# Patient Record
Sex: Female | Born: 1980 | Race: White | Hispanic: No | Marital: Married | Smoking: Never smoker
Health system: Southern US, Community
[De-identification: ages and names within clinical notes are randomized; demographics above are authoritative.]

## PROBLEM LIST (undated history)

## (undated) DIAGNOSIS — J189 Pneumonia, unspecified organism: Secondary | ICD-10-CM

## (undated) DIAGNOSIS — E785 Hyperlipidemia, unspecified: Secondary | ICD-10-CM

## (undated) DIAGNOSIS — G4730 Sleep apnea, unspecified: Secondary | ICD-10-CM

## (undated) DIAGNOSIS — N920 Excessive and frequent menstruation with regular cycle: Secondary | ICD-10-CM

## (undated) DIAGNOSIS — M797 Fibromyalgia: Secondary | ICD-10-CM

## (undated) DIAGNOSIS — K219 Gastro-esophageal reflux disease without esophagitis: Secondary | ICD-10-CM

## (undated) DIAGNOSIS — D509 Iron deficiency anemia, unspecified: Secondary | ICD-10-CM

## (undated) DIAGNOSIS — N840 Polyp of corpus uteri: Secondary | ICD-10-CM

## (undated) DIAGNOSIS — I10 Essential (primary) hypertension: Secondary | ICD-10-CM

## (undated) DIAGNOSIS — E119 Type 2 diabetes mellitus without complications: Secondary | ICD-10-CM

## (undated) DIAGNOSIS — F419 Anxiety disorder, unspecified: Secondary | ICD-10-CM

## (undated) DIAGNOSIS — F32A Depression, unspecified: Secondary | ICD-10-CM

## (undated) DIAGNOSIS — N3946 Mixed incontinence: Secondary | ICD-10-CM

## (undated) HISTORY — PX: KIDNEY SURGERY: SHX687

## (undated) HISTORY — PX: ROOT CANAL: SHX2363

---

## 2003-08-18 ENCOUNTER — Other Ambulatory Visit: Payer: Self-pay

## 2004-09-17 ENCOUNTER — Emergency Department: Payer: Self-pay | Admitting: Emergency Medicine

## 2005-07-08 ENCOUNTER — Observation Stay: Payer: Self-pay

## 2005-08-05 ENCOUNTER — Observation Stay: Payer: Self-pay

## 2005-08-06 ENCOUNTER — Inpatient Hospital Stay: Payer: Self-pay

## 2005-08-31 ENCOUNTER — Observation Stay: Payer: Self-pay | Admitting: Obstetrics and Gynecology

## 2005-09-05 ENCOUNTER — Inpatient Hospital Stay: Payer: Self-pay

## 2006-07-20 ENCOUNTER — Ambulatory Visit: Payer: Self-pay

## 2007-03-25 ENCOUNTER — Ambulatory Visit: Payer: Self-pay

## 2007-09-25 ENCOUNTER — Observation Stay: Payer: Self-pay | Admitting: Obstetrics and Gynecology

## 2007-11-12 ENCOUNTER — Observation Stay: Payer: Self-pay | Admitting: Obstetrics and Gynecology

## 2007-11-15 ENCOUNTER — Inpatient Hospital Stay: Payer: Self-pay

## 2008-09-19 ENCOUNTER — Emergency Department: Payer: Self-pay | Admitting: Emergency Medicine

## 2008-09-24 ENCOUNTER — Ambulatory Visit: Payer: Self-pay | Admitting: Urology

## 2008-09-28 ENCOUNTER — Ambulatory Visit: Payer: Self-pay | Admitting: Urology

## 2008-09-29 ENCOUNTER — Ambulatory Visit: Payer: Self-pay | Admitting: Urology

## 2008-10-21 ENCOUNTER — Ambulatory Visit: Payer: Self-pay | Admitting: Urology

## 2008-11-14 ENCOUNTER — Other Ambulatory Visit: Payer: Self-pay

## 2008-11-17 ENCOUNTER — Ambulatory Visit: Payer: Self-pay

## 2008-11-26 ENCOUNTER — Encounter (INDEPENDENT_AMBULATORY_CARE_PROVIDER_SITE_OTHER): Payer: Self-pay | Admitting: Urology

## 2008-11-26 ENCOUNTER — Inpatient Hospital Stay (HOSPITAL_COMMUNITY): Admission: RE | Admit: 2008-11-26 | Discharge: 2008-11-27 | Payer: Self-pay | Admitting: Urology

## 2010-03-27 ENCOUNTER — Encounter: Payer: Self-pay | Admitting: Urology

## 2010-06-10 LAB — BASIC METABOLIC PANEL
BUN: 10 mg/dL (ref 6–23)
CO2: 25 mEq/L (ref 19–32)
CO2: 28 mEq/L (ref 19–32)
Calcium: 8.6 mg/dL (ref 8.4–10.5)
Calcium: 9.3 mg/dL (ref 8.4–10.5)
Chloride: 107 mEq/L (ref 96–112)
Creatinine, Ser: 0.63 mg/dL (ref 0.4–1.2)
GFR calc Af Amer: >60 mL/min (ref >60)
GFR calc non Af Amer: >60 mL/min (ref >60)
GFR calc non Af Amer: >60 mL/min (ref >60)
GFR calc non Af Amer: >60 mL/min (ref >60)
Glucose, Bld: 104 mg/dL — ABNORMAL HIGH (ref 70–99)
Glucose, Bld: 116 mg/dL — ABNORMAL HIGH (ref 70–99)
Potassium: 3.9 mEq/L (ref 3.5–5.1)
Sodium: 138 mEq/L (ref 135–145)
Sodium: 138 mEq/L (ref 135–145)
Sodium: 139 mEq/L (ref 135–145)

## 2010-06-10 LAB — CBC
HCT: 35.2 % — ABNORMAL LOW (ref 36.0–46.0)
Hemoglobin: 11.7 g/dL — ABNORMAL LOW (ref 12.0–15.0)
Platelets: 231 10*3/uL (ref 150–400)
RBC: 4.49 MIL/uL (ref 3.87–5.11)
RDW: 14.3 % (ref 11.5–15.5)

## 2010-06-10 LAB — CREATININE, FLUID (PLEURAL, PERITONEAL, JP DRAINAGE): Creat, Fluid: 0.6 mg/dL

## 2010-06-10 LAB — HEMOGLOBIN AND HEMATOCRIT, BLOOD
HCT: 32.5 % — ABNORMAL LOW (ref 36.0–46.0)
HCT: 33.2 % — ABNORMAL LOW (ref 36.0–46.0)
Hemoglobin: 10.7 g/dL — ABNORMAL LOW (ref 12.0–15.0)
Hemoglobin: 10.9 g/dL — ABNORMAL LOW (ref 12.0–15.0)

## 2010-06-10 LAB — TYPE AND SCREEN

## 2011-05-07 IMAGING — CT CT STONE STUDY
1 of 2 series · 15 of 32 positions shown, 19 images · non-contrast
Comparison: none

REASON FOR EXAM: l flank pain radiaTING TO GROIN
COMMENTS:

[Series 2: stone · axial · 0.77mm/px · z∈[-520,-78]mm · 15 of 163 slices shown, 19 images]
[im 8/163  soft-tissue]
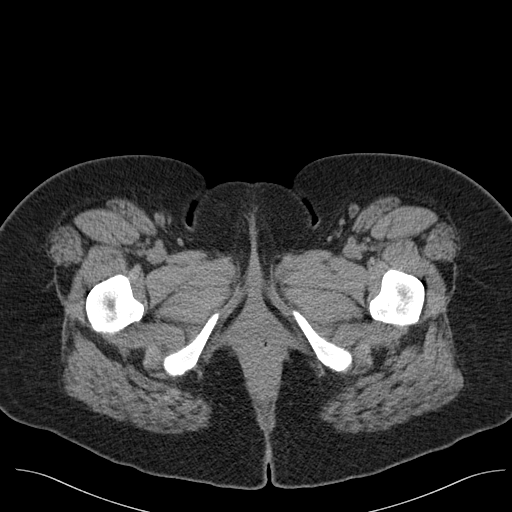
[im 8/163  bone]
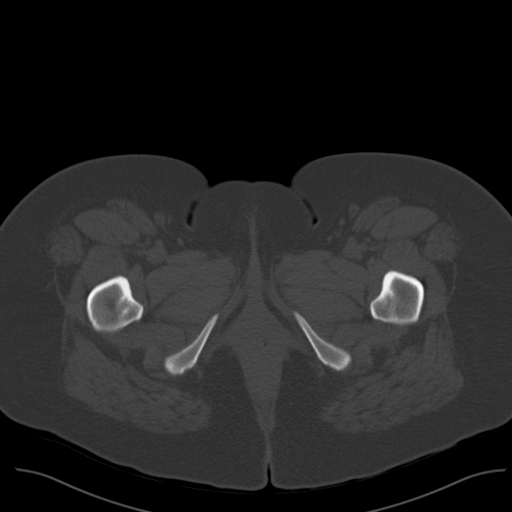
[im 22/163  soft-tissue]
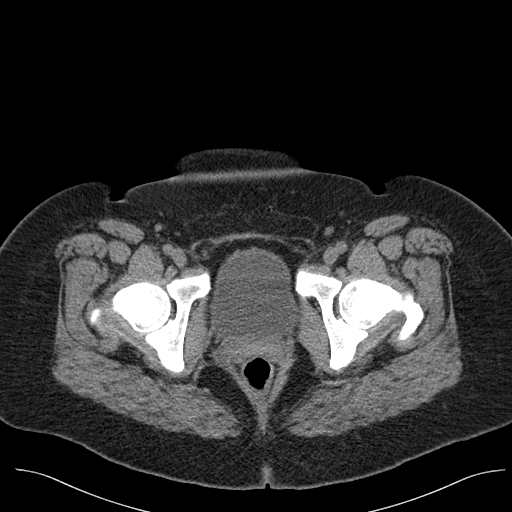
[im 36/163  soft-tissue]
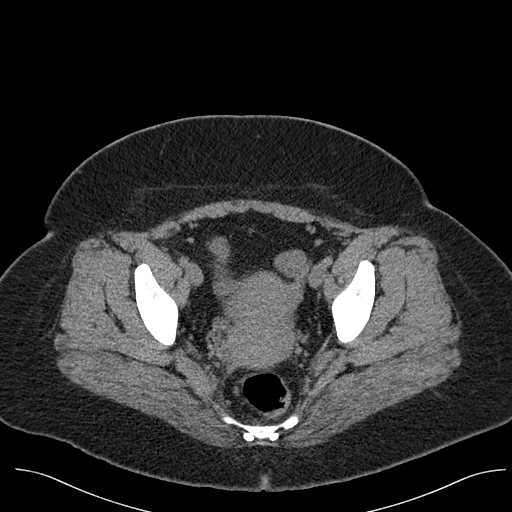
[im 43/163  soft-tissue]
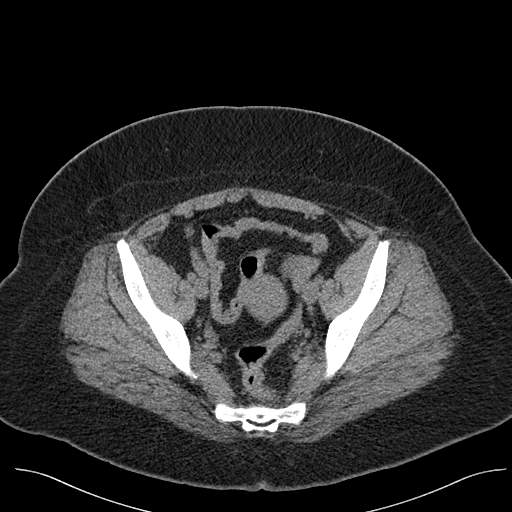
[im 57/163  soft-tissue]
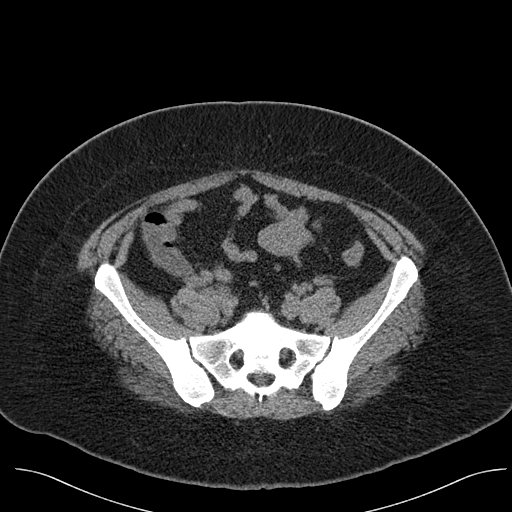
[im 71/163  soft-tissue]
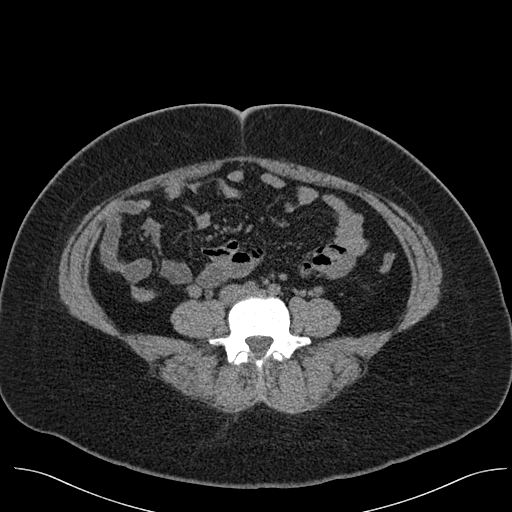
[im 85/163  soft-tissue]
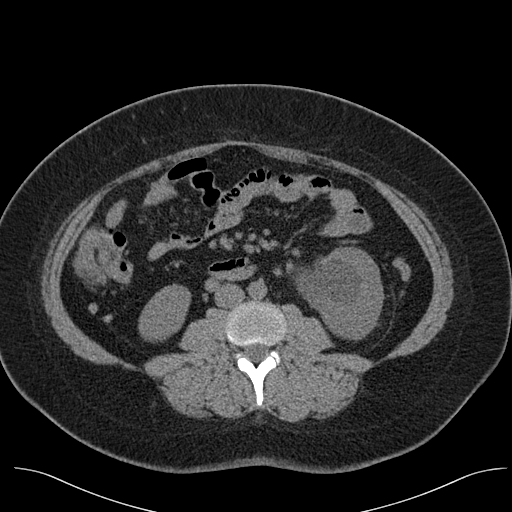
[im 92/163  soft-tissue]
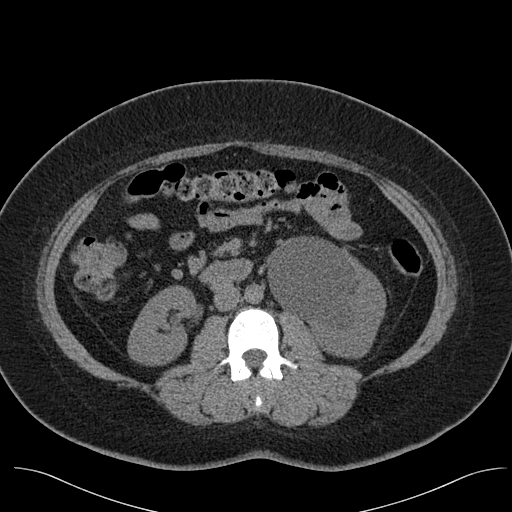
[im 106/163  soft-tissue]
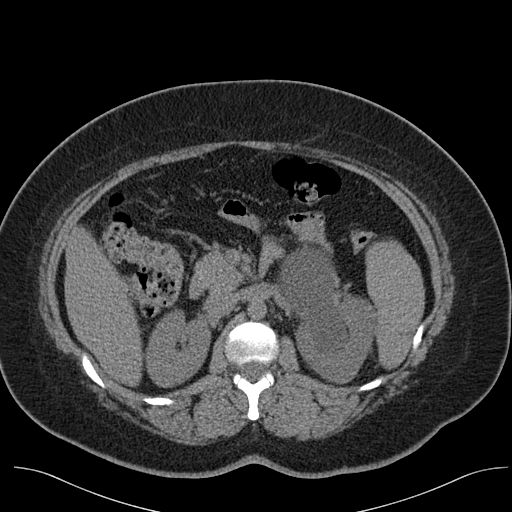
[im 106/163  bone]
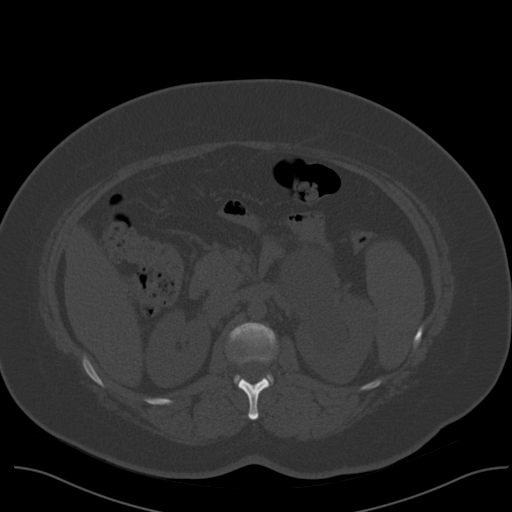
[im 120/163  soft-tissue]
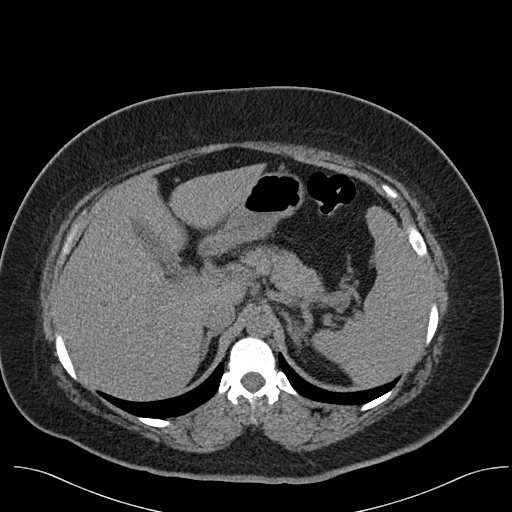
[im 127/163  soft-tissue]
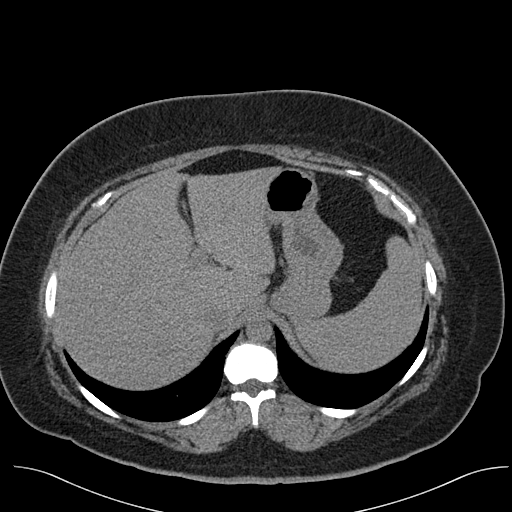
[im 134/163  lung]
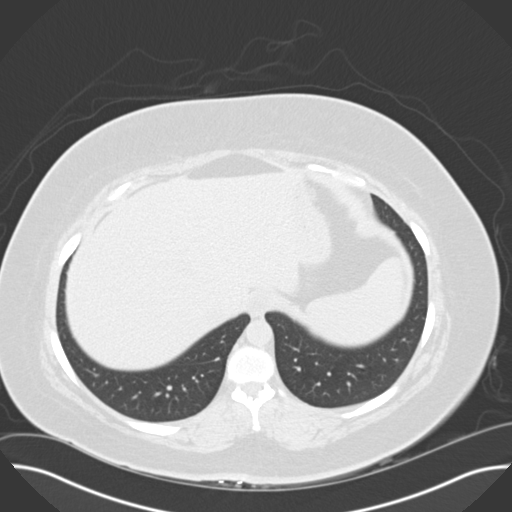
[im 141/163  soft-tissue]
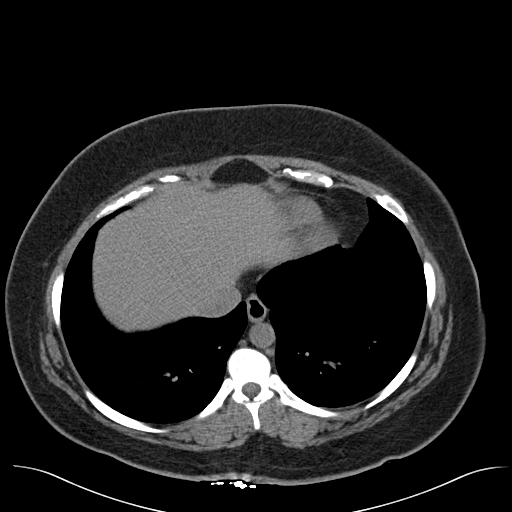
[im 141/163  lung]
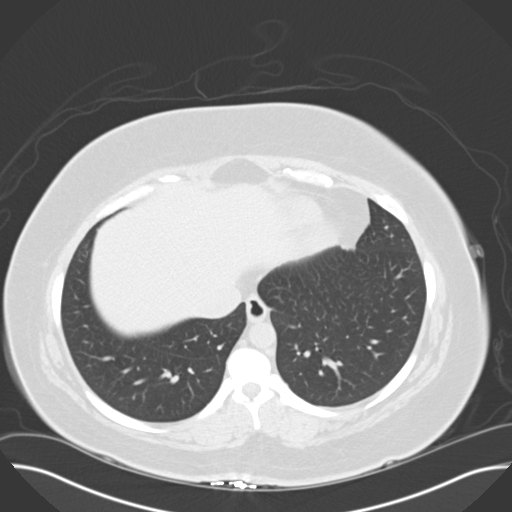
[im 148/163  lung]
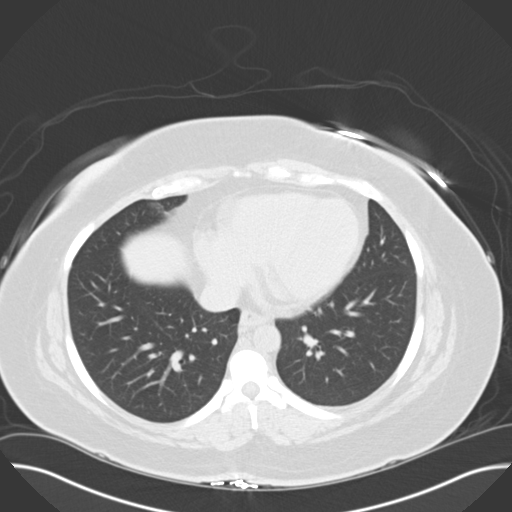
[im 155/163  soft-tissue]
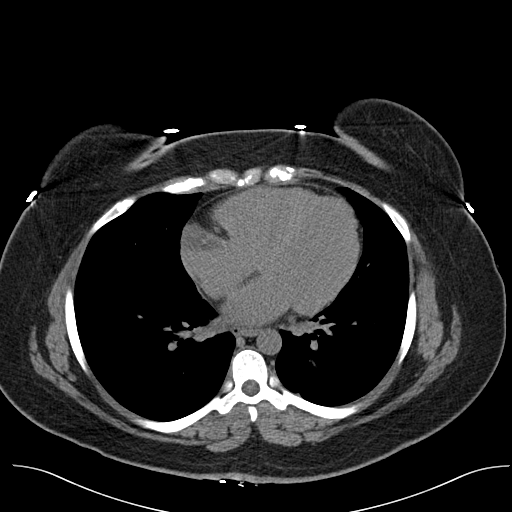
[im 155/163  lung]
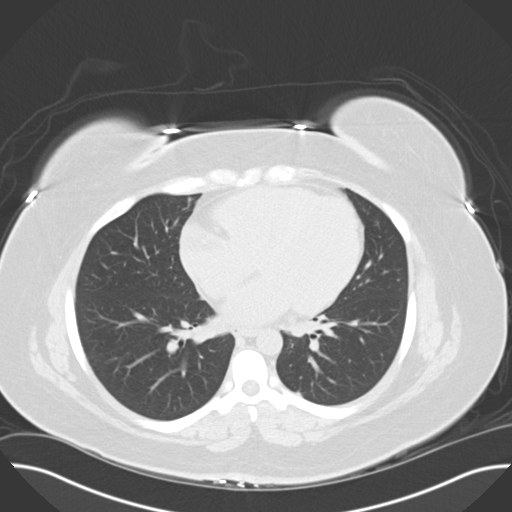

[15 of 32 positions shown; findings below may reference images not displayed]

PROCEDURE:     CT  - CT ABDOMEN /PELVIS WO (STONE)  - September 19, 2008  [DATE]

RESULT:        Helical 3 mm sections were obtained from the lung bases
through the pubic symphysis without the administration of oral or
intravenous contrast.

Evaluation of the lung bases demonstrates no gross abnormalities.

Noncontrast evaluation of the liver, spleen, adrenals and pancreas is
unremarkable.

Evaluation of the left kidney demonstrates moderate to severe hydronephrosis
as well as severe pelviectasis versus possibly a large parapelvic cyst.
There is no significant hydroureter. Projecting within the distal left
ureter, a 5 mm calculus is appreciated.  There is no evidence of abdominal
or pelvic free fluid, drainable loculated fluid collections. No secondary
signs reflecting bowel obstruction, enteritis, colitis, diverticulitis,
appendicitis or an abdominal aortic aneurysm is appreciated.
IMPRESSION: 1.     Findings consistent with a distal left ureteral calculus with
associated severe obstructive uropathy. Note, the findings within the kidney
raise the suspicion of either a parapelvic cyst compromising a portion of
the low attenuating area within the region of the renal pelvis versus
possibly a component of a partial UPJ obstruction. Alternatively, the severe
pelvic findings may be secondary to the previously described calculus.
2.     Dr. Avvisati of the Emergency Department was informed of these
findings at the time of the initial interpretation.

## 2011-06-08 IMAGING — CR DG ABDOMEN 1V
1 series · 2 of 2 positions shown · non-contrast
Comparison: none

REASON FOR EXAM: hydronephrosis  renal colic pt need films
COMMENTS:

PROCEDURE:     DXR - DXR KIDNEY URETER BLADDER  - October 21, 2008  [DATE]
RESULT:     There is a double-J ureteral stent in place on the left. The
bowel gas pattern is normal. The bony structures exhibit no acute
abnormality.

[Series 1: view not recorded · 0.17mm/px · 2 of 2 slices shown]
[im 1/2]
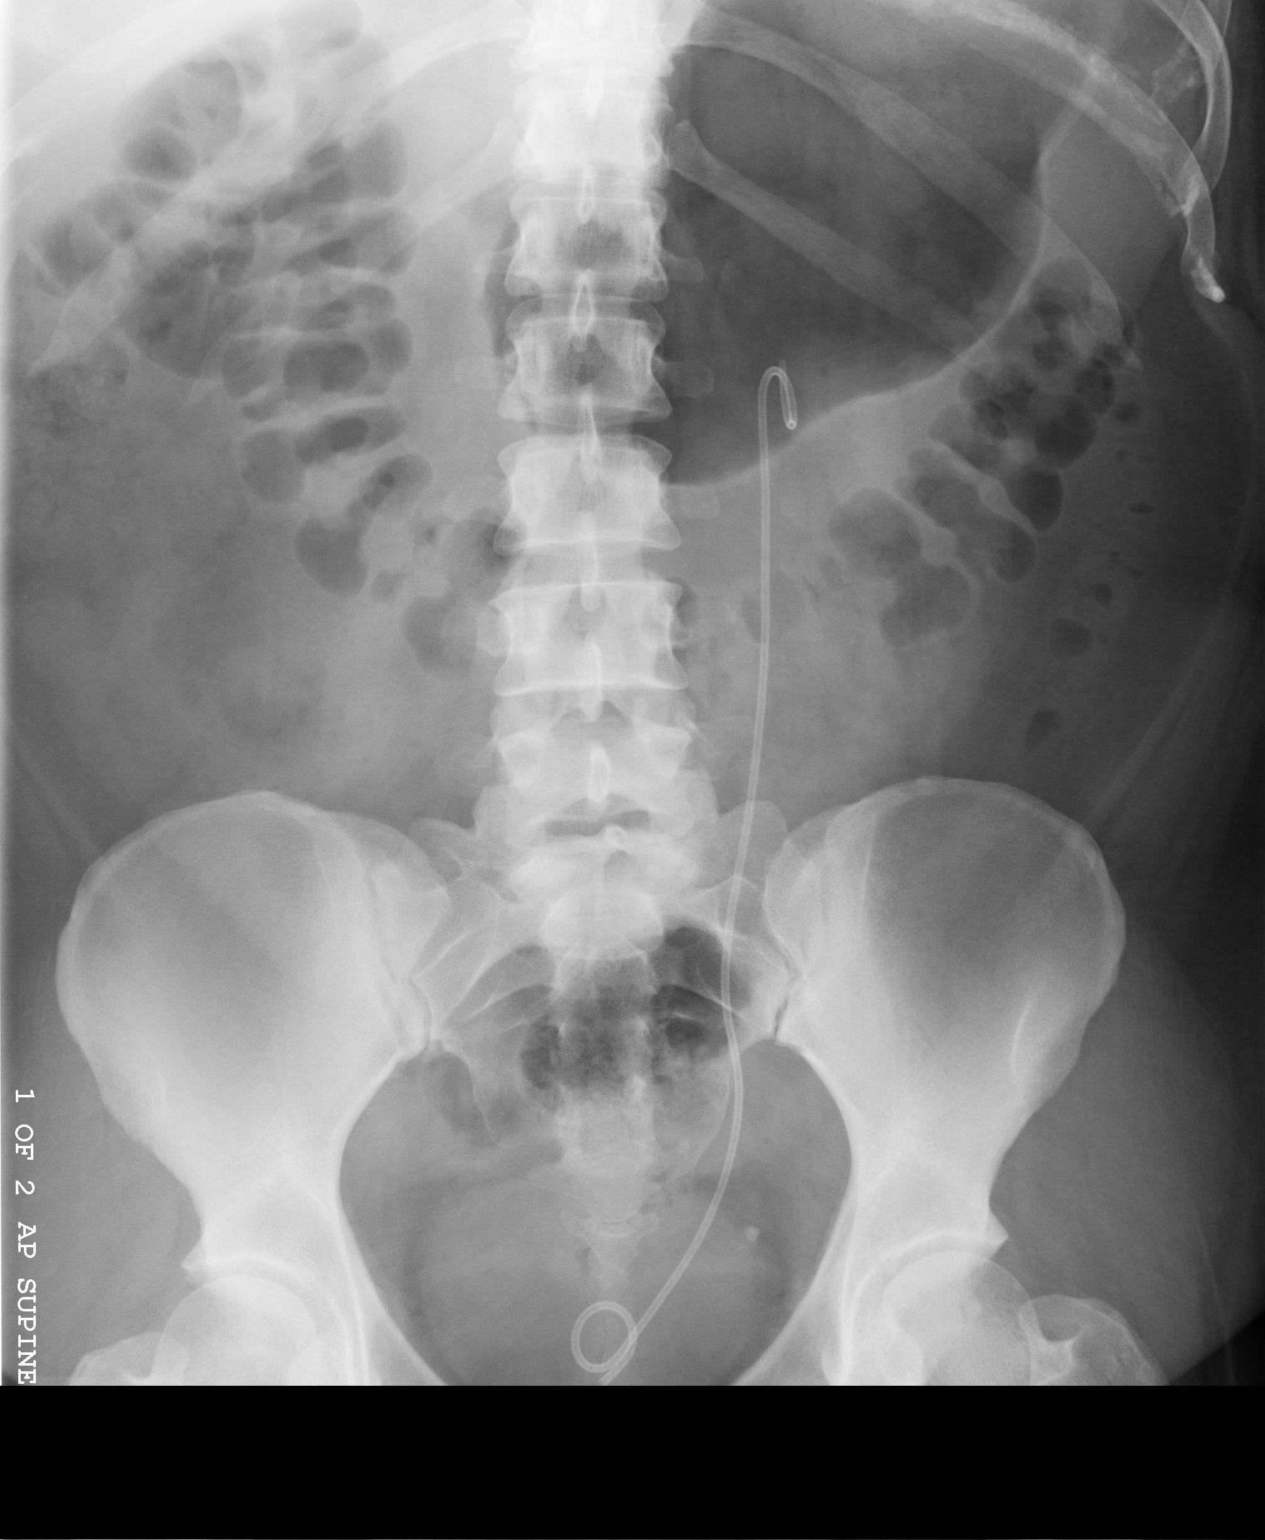
[im 2/2]
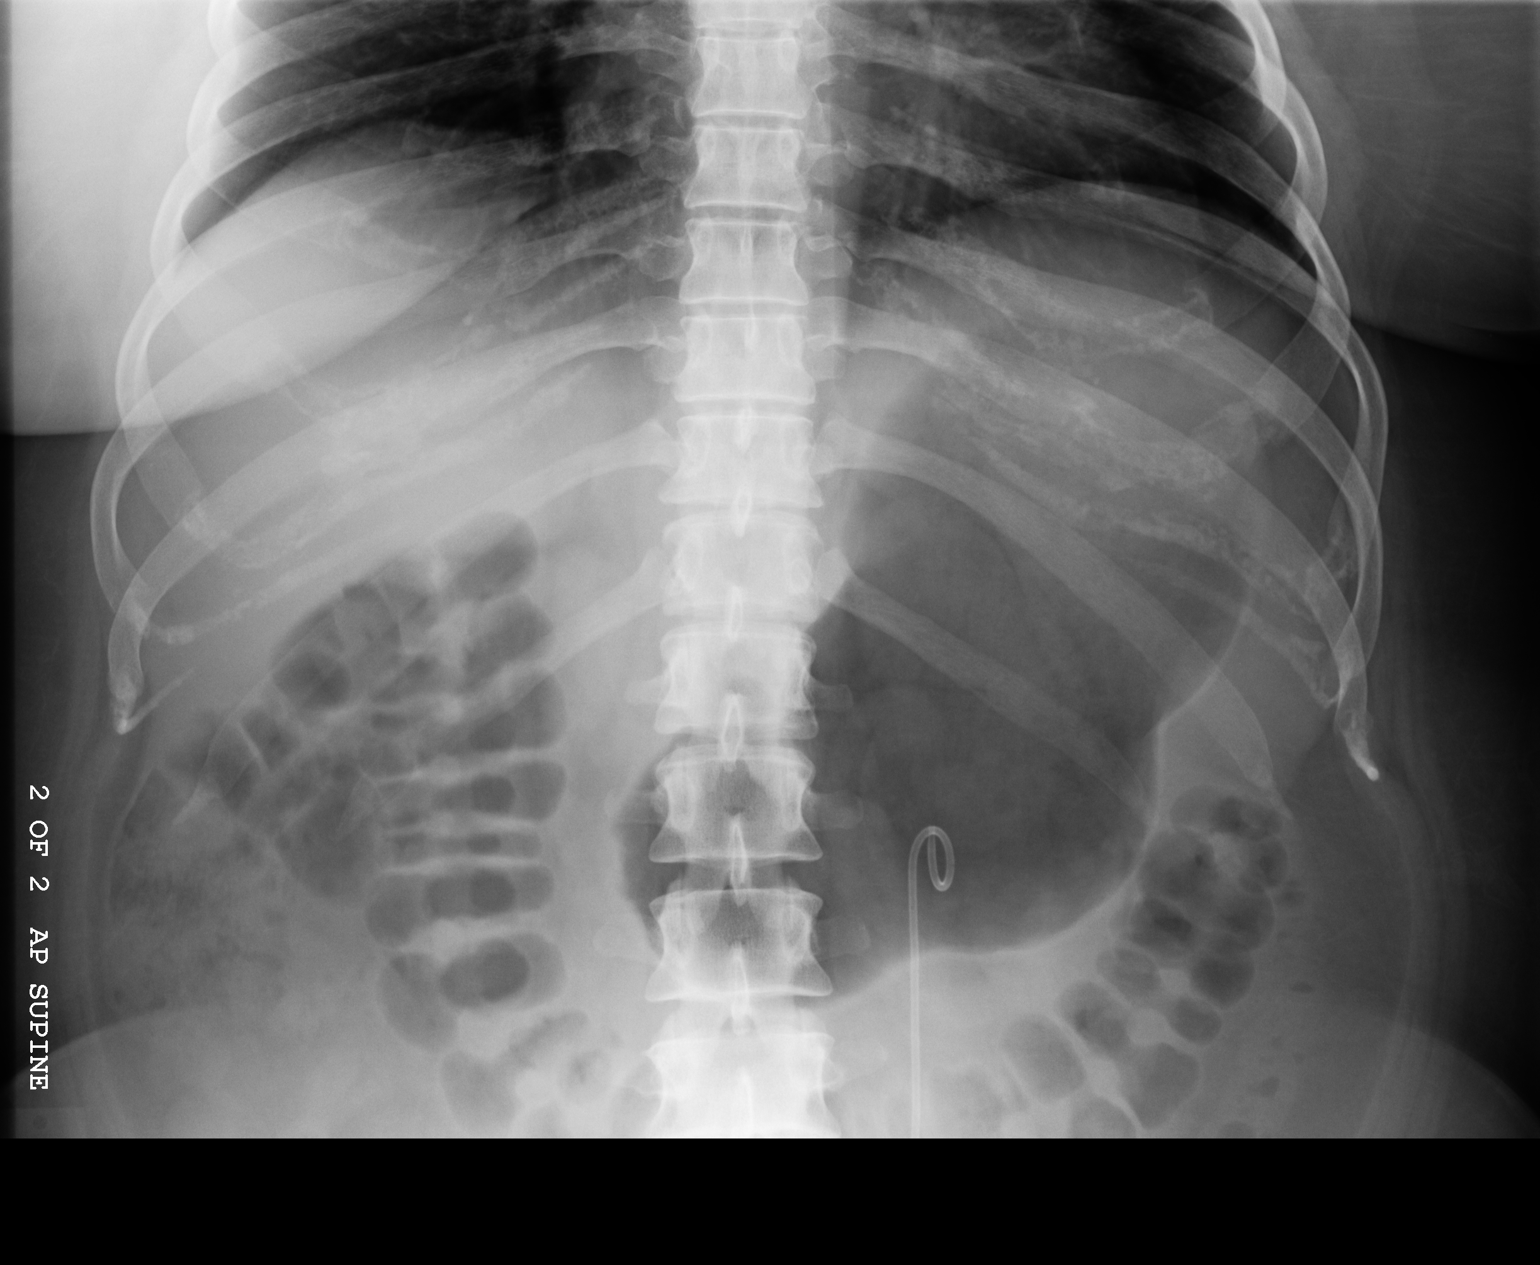

[2 of 2 positions shown; findings below may reference images not displayed]

IMPRESSION: There is a double-J ureteral stent in place on the left.

## 2014-11-11 ENCOUNTER — Other Ambulatory Visit: Payer: Self-pay | Admitting: Adult Health

## 2014-11-11 ENCOUNTER — Other Ambulatory Visit: Payer: Self-pay | Admitting: Family Medicine

## 2014-11-11 DIAGNOSIS — R599 Enlarged lymph nodes, unspecified: Secondary | ICD-10-CM

## 2014-11-17 ENCOUNTER — Ambulatory Visit
Admission: RE | Admit: 2014-11-17 | Discharge: 2014-11-17 | Disposition: A | Discharge status: Home / Self Care | Payer: Self-pay | Source: Ambulatory Visit | Attending: Family Medicine | Admitting: Family Medicine

## 2014-11-17 DIAGNOSIS — R591 Generalized enlarged lymph nodes: Secondary | ICD-10-CM | POA: Insufficient documentation

## 2014-11-17 DIAGNOSIS — R599 Enlarged lymph nodes, unspecified: Secondary | ICD-10-CM

## 2015-06-04 ENCOUNTER — Encounter: Payer: Self-pay | Admitting: *Deleted

## 2015-06-07 ENCOUNTER — Ambulatory Visit: Payer: Self-pay | Admitting: Certified Registered Nurse Anesthetist

## 2015-06-07 ENCOUNTER — Encounter
Admission: RE | Disposition: A | Discharge status: Home / Self Care | Payer: Self-pay | Source: Ambulatory Visit | Attending: Unknown Physician Specialty

## 2015-06-07 ENCOUNTER — Ambulatory Visit
Admission: RE | Admit: 2015-06-07 | Discharge: 2015-06-07 | Disposition: A | Discharge status: Home / Self Care | Payer: Self-pay | Source: Ambulatory Visit | Attending: Unknown Physician Specialty | Admitting: Unknown Physician Specialty

## 2015-06-07 ENCOUNTER — Encounter: Payer: Self-pay | Admitting: *Deleted

## 2015-06-07 DIAGNOSIS — K210 Gastro-esophageal reflux disease with esophagitis: Secondary | ICD-10-CM | POA: Insufficient documentation

## 2015-06-07 DIAGNOSIS — K2950 Unspecified chronic gastritis without bleeding: Secondary | ICD-10-CM | POA: Insufficient documentation

## 2015-06-07 DIAGNOSIS — Z79899 Other long term (current) drug therapy: Secondary | ICD-10-CM | POA: Insufficient documentation

## 2015-06-07 DIAGNOSIS — F419 Anxiety disorder, unspecified: Secondary | ICD-10-CM | POA: Insufficient documentation

## 2015-06-07 DIAGNOSIS — K319 Disease of stomach and duodenum, unspecified: Secondary | ICD-10-CM | POA: Insufficient documentation

## 2015-06-07 DIAGNOSIS — R1013 Epigastric pain: Secondary | ICD-10-CM | POA: Insufficient documentation

## 2015-06-07 DIAGNOSIS — I10 Essential (primary) hypertension: Secondary | ICD-10-CM | POA: Insufficient documentation

## 2015-06-07 HISTORY — DX: Anxiety disorder, unspecified: F41.9

## 2015-06-07 HISTORY — DX: Gastro-esophageal reflux disease without esophagitis: K21.9

## 2015-06-07 HISTORY — PX: ESOPHAGOGASTRODUODENOSCOPY (EGD) WITH PROPOFOL: SHX5813

## 2015-06-07 HISTORY — DX: Essential (primary) hypertension: I10

## 2015-06-07 LAB — POCT PREGNANCY, URINE: Preg Test, Ur: NEGATIVE

## 2015-06-07 SURGERY — ESOPHAGOGASTRODUODENOSCOPY (EGD) WITH PROPOFOL
Anesthesia: General

## 2015-06-07 MED ORDER — PROPOFOL 10 MG/ML IV BOLUS
INTRAVENOUS | Status: DC | PRN
  Administered 2015-06-07: 30 mg via INTRAVENOUS
  Administered 2015-06-07: 20 mg via INTRAVENOUS
  Administered 2015-06-07: 10 mg via INTRAVENOUS
  Administered 2015-06-07: 20 mg via INTRAVENOUS

## 2015-06-07 MED ORDER — PROPOFOL 500 MG/50ML IV EMUL
INTRAVENOUS | Status: DC | PRN
  Administered 2015-06-07: 120 ug/kg/min via INTRAVENOUS

## 2015-06-07 MED ORDER — LIDOCAINE HCL (CARDIAC) 20 MG/ML IV SOLN
INTRAVENOUS | Status: DC | PRN
  Administered 2015-06-07: 60 mg via INTRAVENOUS

## 2015-06-07 MED ORDER — SODIUM CHLORIDE 0.9 % IV SOLN
INTRAVENOUS | Status: DC
  Administered 2015-06-07: 1000 mL via INTRAVENOUS

## 2015-06-07 MED ORDER — SODIUM CHLORIDE 0.9 % IV SOLN: INTRAVENOUS | Status: DC

## 2015-06-07 MED ORDER — MIDAZOLAM HCL 2 MG/2ML IJ SOLN
INTRAMUSCULAR | Status: DC | PRN
  Administered 2015-06-07: 1 mg via INTRAVENOUS

## 2015-06-07 NOTE — Op Note (Signed)
[facility] Gastroenterology Patient Name: Samantha Good Procedure Date: 06/07/2015 3:01 PM MRN: [id number] Account #: [id number] Date of Birth: 06-02-1980 Admit Type: Outpatient Age: 35 Room: [facility] ENDO ROOM 1 Gender: Female Note Status: Finalized Procedure:            Upper GI endoscopy Indications:          Epigastric abdominal pain, Heartburn Providers:            Manya Silvas, MD Referring MD:         Youlanda Roys. Lovie Macadamia, MD (Referring MD) Medicines:            Propofol per Anesthesia Complications:        No immediate complications. Procedure:            Pre-Anesthesia Assessment:                       - After reviewing the risks and benefits, the patient                        was deemed in satisfactory condition to undergo the                        procedure.                       After obtaining informed consent, the endoscope was                        passed under direct vision. Throughout the procedure,                        the patient's blood pressure, pulse, and oxygen                        saturations were monitored continuously. The Endoscope                        was introduced through the mouth, and advanced to the                        second part of duodenum. The upper GI endoscopy was                        accomplished without difficulty. The patient tolerated                        the procedure well. Findings:      LA Grade A (one or more mucosal breaks less than 5 mm, not extending       between tops of 2 mucosal folds) esophagitis with no bleeding was found       36 cm from the incisors. Biopsies were taken with a cold forceps for       histology.      Diffuse mildly erythematous mucosa without bleeding was found in the       gastric antrum. Biopsies were taken with a cold forceps for histology.       Biopsies were taken with a cold forceps for Helicobacter pylori testing.      Diffuse minimal inflammation characterized by  erythema and granularity       was found in the gastric  body. Biopsies were taken with a cold forceps       for histology. Biopsies were taken with a cold forceps for Helicobacter       pylori testing.      The examined duodenum was normal. Impression:           - LA Grade A reflux esophagitis. Rule out Barrett's                        esophagus. Biopsied.                       - Erythematous mucosa in the antrum. Biopsied.                       - Gastritis. Biopsied.                       - Normal examined duodenum.                       Continue Omeprazole, consider adding in Zantac or                        increase omeprazole. Recommendation:       - Await pathology results. Manya Silvas, MD 06/07/2015 3:40:02 PM This report has been signed electronically. Number of Addenda: 0 Note Initiated On: 06/07/2015 3:01 PM      [facility]

## 2015-06-07 NOTE — Anesthesia Procedure Notes (Signed)
Date/Time: 06/07/2015 3:13 PM Performed by: Johnna Acosta Pre-anesthesia Checklist: Patient identified, Emergency Drugs available, Suction available, Patient being monitored and Timeout performed Patient Re-evaluated:Patient Re-evaluated prior to inductionOxygen Delivery Method: Nasal cannula

## 2015-06-07 NOTE — Transfer of Care (Signed)
Immediate Anesthesia Transfer of Care Note  Patient: Samantha Good  Procedure(s) Performed: Procedure(s): ESOPHAGOGASTRODUODENOSCOPY (EGD) WITH PROPOFOL (N/A)  Patient Location: PACU  Anesthesia Type:General  Level of Consciousness: sedated  Airway & Oxygen Therapy: Patient Spontanous Breathing and Patient connected to nasal cannula oxygen  Post-op Assessment: Report given to RN and Post -op Vital signs reviewed and stable  Post vital signs: Reviewed and stable  Last Vitals:  Filed Vitals:   06/07/15 1445  BP: 144/93  Pulse: 95  Temp: 35.9 C  Resp: 20    Complications: No apparent anesthesia complications

## 2015-06-07 NOTE — H&P (Signed)
   Primary Care Physician:  Juluis Pitch, MD Primary Gastroenterologist:  Dr. Vira Agar  Pre-Procedure History & Physical: HPI:  Samantha Good is a 35 y.o. female is here for an endoscopy.   Past Medical History  Diagnosis Date  . GERD (gastroesophageal reflux disease)   . Anxiety   . Hypertension     Past Surgical History  Procedure Laterality Date  . Kidney surgery      Prior to Admission medications   Medication Sig Start Date End Date Taking? Authorizing Provider  ALPRAZolam Duanne Moron) 0.5 MG tablet Take 0.5 mg by mouth at bedtime as needed for anxiety.   Yes Historical Provider, MD  buPROPion (WELLBUTRIN XL) 150 MG 24 hr tablet Take 150 mg by mouth daily.   Yes Historical Provider, MD  lisinopril (PRINIVIL,ZESTRIL) 20 MG tablet Take 20 mg by mouth daily.   Yes Historical Provider, MD  sertraline (ZOLOFT) 100 MG tablet Take 100 mg by mouth daily.   Yes Historical Provider, MD  doxycycline (MONODOX) 100 MG capsule Take 100 mg by mouth 2 (two) times daily. Reported on 06/07/2015    Historical Provider, MD    Allergies as of 05/28/2015  . (Not on File)    History reviewed. No pertinent family history.  Social History   Social History  . Marital Status: Married    Spouse Name: N/A  . Number of Children: N/A  . Years of Education: N/A   Occupational History  . Not on file.   Social History Main Topics  . Smoking status: Never Smoker   . Smokeless tobacco: Never Used  . Alcohol Use: No  . Drug Use: No  . Sexual Activity: Not on file   Other Topics Concern  . Not on file   Social History Narrative    Review of Systems: See HPI, otherwise negative ROS  Physical Exam: BP 144/93 mmHg  Pulse 95  Temp(Src) 96.7 F (35.9 C) (Tympanic)  Resp 20  Ht 5\' 1"  (1.549 m)  Wt 92.534 kg (204 lb)  BMI 38.57 kg/m2  SpO2 100%  LMP 06/01/2015 General:   Alert,  pleasant and cooperative in NAD Head:  Normocephalic and atraumatic. Neck:  Supple; no masses or  thyromegaly. Lungs:  Clear throughout to auscultation.    Heart:  Regular rate and rhythm. Abdomen:  Soft, nontender and nondistended. Normal bowel sounds, without guarding, and without rebound.   Neurologic:  Alert and  oriented x4;  grossly normal neurologically.  Impression/Plan: Samantha Good is here for an endoscopy to be performed for heartburn and abdominal pain  Risks, benefits, limitations, and alternatives regarding  endoscopy have been reviewed with the patient.  Questions have been answered.  All parties agreeable.   Gaylyn Cheers, MD  06/07/2015, 3:11 PM

## 2015-06-07 NOTE — Anesthesia Preprocedure Evaluation (Signed)
Anesthesia Evaluation  Patient identified by MRN, date of birth, ID band Patient awake    Reviewed: Allergy & Precautions, H&P , NPO status , Patient's Chart, lab work & pertinent test results, reviewed documented beta blocker date and time   History of Anesthesia Complications Negative for: history of anesthetic complications  Airway Mallampati: II  TM Distance: >3 FB Neck ROM: full    Dental no notable dental hx. (+) Missing, Teeth Intact   Pulmonary neg pulmonary ROS,    Pulmonary exam normal breath sounds clear to auscultation       Cardiovascular Exercise Tolerance: Good hypertension, On Medications (-) angina(-) CAD, (-) Past MI, (-) Cardiac Stents and (-) CABG Normal cardiovascular exam(-) dysrhythmias (-) Valvular Problems/Murmurs Rhythm:regular Rate:Normal     Neuro/Psych negative neurological ROS  negative psych ROS   GI/Hepatic Neg liver ROS, GERD  ,  Endo/Other  diabetes (borderline)Morbid obesity  Renal/GU negative Renal ROS  negative genitourinary   Musculoskeletal   Abdominal   Peds  Hematology negative hematology ROS (+)   Anesthesia Other Findings Past Medical History:   GERD (gastroesophageal reflux disease)                       Anxiety                                                      Hypertension                                                 Reproductive/Obstetrics negative OB ROS                             Anesthesia Physical Anesthesia Plan  ASA: III  Anesthesia Plan: General   Post-op Pain Management:    Induction:   Airway Management Planned:   Additional Equipment:   Intra-op Plan:   Post-operative Plan:   Informed Consent: I have reviewed the patients History and Physical, chart, labs and discussed the procedure including the risks, benefits and alternatives for the proposed anesthesia with the patient or authorized representative who has  indicated his/her understanding and acceptance.   Dental Advisory Given  Plan Discussed with: Anesthesiologist, CRNA and Surgeon  Anesthesia Plan Comments:         Anesthesia Quick Evaluation

## 2015-06-08 ENCOUNTER — Encounter: Payer: Self-pay | Admitting: Unknown Physician Specialty

## 2015-06-08 NOTE — Anesthesia Postprocedure Evaluation (Signed)
Anesthesia Post Note  Patient: Samantha Good  Procedure(s) Performed: Procedure(s) (LRB): ESOPHAGOGASTRODUODENOSCOPY (EGD) WITH PROPOFOL (N/A)  Patient location during evaluation: Endoscopy Anesthesia Type: General Level of consciousness: awake and alert Pain management: pain level controlled Vital Signs Assessment: post-procedure vital signs reviewed and stable Respiratory status: spontaneous breathing, nonlabored ventilation, respiratory function stable and patient connected to nasal cannula oxygen Cardiovascular status: blood pressure returned to baseline and stable Postop Assessment: no signs of nausea or vomiting Anesthetic complications: no    Last Vitals:  Filed Vitals:   06/07/15 1553 06/07/15 1603  BP: 127/86 139/72  Pulse: 86 78  Temp:    Resp: 21 16    Last Pain:  Filed Vitals:   06/08/15 0801  PainSc: 0-No pain                 [name]

## 2015-06-09 LAB — SURGICAL PATHOLOGY

## 2016-07-05 ENCOUNTER — Other Ambulatory Visit: Payer: Self-pay | Admitting: Nurse Practitioner

## 2016-07-05 DIAGNOSIS — R109 Unspecified abdominal pain: Secondary | ICD-10-CM

## 2016-07-06 ENCOUNTER — Ambulatory Visit
Admission: RE | Admit: 2016-07-06 | Discharge: 2016-07-06 | Disposition: A | Discharge status: Home / Self Care | Payer: BLUE CROSS/BLUE SHIELD | Source: Ambulatory Visit | Attending: Nurse Practitioner | Admitting: Nurse Practitioner

## 2016-07-06 DIAGNOSIS — R109 Unspecified abdominal pain: Secondary | ICD-10-CM | POA: Diagnosis not present

## 2016-07-06 DIAGNOSIS — R160 Hepatomegaly, not elsewhere classified: Secondary | ICD-10-CM | POA: Diagnosis not present

## 2017-07-31 DIAGNOSIS — L709 Acne, unspecified: Secondary | ICD-10-CM | POA: Diagnosis not present

## 2017-07-31 DIAGNOSIS — R05 Cough: Secondary | ICD-10-CM | POA: Diagnosis not present

## 2017-08-13 DIAGNOSIS — I10 Essential (primary) hypertension: Secondary | ICD-10-CM | POA: Diagnosis not present

## 2017-08-13 DIAGNOSIS — Z79899 Other long term (current) drug therapy: Secondary | ICD-10-CM | POA: Diagnosis not present

## 2017-08-13 DIAGNOSIS — D649 Anemia, unspecified: Secondary | ICD-10-CM | POA: Diagnosis not present

## 2017-11-27 ENCOUNTER — Other Ambulatory Visit: Payer: Self-pay | Admitting: Nurse Practitioner

## 2017-11-27 DIAGNOSIS — K7689 Other specified diseases of liver: Secondary | ICD-10-CM

## 2017-11-27 DIAGNOSIS — R1013 Epigastric pain: Secondary | ICD-10-CM | POA: Diagnosis not present

## 2017-11-27 DIAGNOSIS — D649 Anemia, unspecified: Secondary | ICD-10-CM | POA: Diagnosis not present

## 2017-11-29 ENCOUNTER — Ambulatory Visit
Admission: RE | Admit: 2017-11-29 | Discharge: 2017-11-29 | Disposition: A | Discharge status: Home / Self Care | Payer: 59 | Source: Ambulatory Visit | Attending: Nurse Practitioner | Admitting: Nurse Practitioner

## 2017-11-29 DIAGNOSIS — K7689 Other specified diseases of liver: Secondary | ICD-10-CM | POA: Diagnosis not present

## 2017-11-29 DIAGNOSIS — D1803 Hemangioma of intra-abdominal structures: Secondary | ICD-10-CM | POA: Diagnosis not present

## 2017-11-29 DIAGNOSIS — D649 Anemia, unspecified: Secondary | ICD-10-CM | POA: Insufficient documentation

## 2017-11-29 DIAGNOSIS — D1809 Hemangioma of other sites: Secondary | ICD-10-CM | POA: Diagnosis not present

## 2017-11-29 MED ORDER — GADOBENATE DIMEGLUMINE 529 MG/ML IV SOLN
20.0000 mL | Freq: Once | INTRAVENOUS | Status: AC | PRN
  Administered 2017-11-29: 19 mL via INTRAVENOUS

## 2017-12-07 ENCOUNTER — Other Ambulatory Visit: Payer: BLUE CROSS/BLUE SHIELD

## 2017-12-18 DIAGNOSIS — K5901 Slow transit constipation: Secondary | ICD-10-CM | POA: Diagnosis not present

## 2017-12-18 DIAGNOSIS — K210 Gastro-esophageal reflux disease with esophagitis: Secondary | ICD-10-CM | POA: Diagnosis not present

## 2018-01-17 DIAGNOSIS — K2950 Unspecified chronic gastritis without bleeding: Secondary | ICD-10-CM | POA: Diagnosis not present

## 2018-01-17 DIAGNOSIS — K449 Diaphragmatic hernia without obstruction or gangrene: Secondary | ICD-10-CM | POA: Diagnosis not present

## 2018-01-17 DIAGNOSIS — K2970 Gastritis, unspecified, without bleeding: Secondary | ICD-10-CM | POA: Diagnosis not present

## 2018-01-17 DIAGNOSIS — D509 Iron deficiency anemia, unspecified: Secondary | ICD-10-CM | POA: Diagnosis not present

## 2018-01-17 DIAGNOSIS — K3189 Other diseases of stomach and duodenum: Secondary | ICD-10-CM | POA: Diagnosis not present

## 2018-01-17 DIAGNOSIS — K5900 Constipation, unspecified: Secondary | ICD-10-CM | POA: Diagnosis not present

## 2018-01-17 DIAGNOSIS — K640 First degree hemorrhoids: Secondary | ICD-10-CM | POA: Diagnosis not present

## 2018-02-12 DIAGNOSIS — Z79899 Other long term (current) drug therapy: Secondary | ICD-10-CM | POA: Diagnosis not present

## 2018-02-12 DIAGNOSIS — K219 Gastro-esophageal reflux disease without esophagitis: Secondary | ICD-10-CM | POA: Diagnosis not present

## 2018-02-12 DIAGNOSIS — I10 Essential (primary) hypertension: Secondary | ICD-10-CM | POA: Diagnosis not present

## 2018-02-12 DIAGNOSIS — D509 Iron deficiency anemia, unspecified: Secondary | ICD-10-CM | POA: Diagnosis not present

## 2018-02-12 DIAGNOSIS — Z0000 Encounter for general adult medical examination without abnormal findings: Secondary | ICD-10-CM | POA: Diagnosis not present

## 2018-02-12 DIAGNOSIS — Z23 Encounter for immunization: Secondary | ICD-10-CM | POA: Diagnosis not present

## 2018-04-09 DIAGNOSIS — G4733 Obstructive sleep apnea (adult) (pediatric): Secondary | ICD-10-CM | POA: Diagnosis not present

## 2018-05-01 ENCOUNTER — Encounter: Payer: Self-pay | Admitting: *Deleted

## 2018-05-01 ENCOUNTER — Emergency Department: Payer: 59

## 2018-05-01 ENCOUNTER — Emergency Department
Admission: EM | Admit: 2018-05-01 | Discharge: 2018-05-01 | Disposition: A | Discharge status: Home / Self Care | Payer: 59 | Attending: Emergency Medicine | Admitting: Emergency Medicine

## 2018-05-01 DIAGNOSIS — I10 Essential (primary) hypertension: Secondary | ICD-10-CM | POA: Insufficient documentation

## 2018-05-01 DIAGNOSIS — R209 Unspecified disturbances of skin sensation: Secondary | ICD-10-CM | POA: Diagnosis not present

## 2018-05-01 DIAGNOSIS — R202 Paresthesia of skin: Secondary | ICD-10-CM | POA: Insufficient documentation

## 2018-05-01 DIAGNOSIS — R51 Headache: Secondary | ICD-10-CM | POA: Diagnosis not present

## 2018-05-01 LAB — URINALYSIS, COMPLETE (UACMP) WITH MICROSCOPIC
BACTERIA UA: NONE SEEN
Bilirubin Urine: NEGATIVE
Glucose, UA: NEGATIVE mg/dL
Hgb urine dipstick: NEGATIVE
KETONES UR: NEGATIVE mg/dL
Leukocytes,Ua: NEGATIVE
NITRITE: NEGATIVE
PH: 8 (ref 5.0–8.0)
Protein, ur: NEGATIVE mg/dL
Specific Gravity, Urine: 1.005 (ref 1.005–1.030)
Squamous Epithelial / LPF: NONE SEEN (ref 0–5)

## 2018-05-01 LAB — COMPREHENSIVE METABOLIC PANEL
ALT: 13 U/L (ref 0–44)
AST: 19 U/L (ref 15–41)
Albumin: 4.8 g/dL (ref 3.5–5.0)
Alkaline Phosphatase: 68 U/L (ref 38–126)
Anion gap: 10 (ref 5–15)
BUN: 10 mg/dL (ref 6–20)
CO2: 22 mmol/L (ref 22–32)
CREATININE: 0.83 mg/dL (ref 0.44–1.00)
Calcium: 9.6 mg/dL (ref 8.9–10.3)
Chloride: 108 mmol/L (ref 98–111)
GFR calc non Af Amer: >60 mL/min (ref >60)
Glucose, Bld: 109 mg/dL — ABNORMAL HIGH (ref 70–99)
Potassium: 3.8 mmol/L (ref 3.5–5.1)
SODIUM: 140 mmol/L (ref 135–145)
Total Bilirubin: 0.3 mg/dL (ref 0.3–1.2)
Total Protein: 7.9 g/dL (ref 6.5–8.1)

## 2018-05-01 LAB — CBC WITH DIFFERENTIAL/PLATELET
ABS IMMATURE GRANULOCYTES: 0.03 10*3/uL (ref 0.00–0.07)
BASOS PCT: 0 %
Basophils Absolute: 0.1 10*3/uL (ref 0.0–0.1)
Eosinophils Absolute: 0.1 10*3/uL (ref 0.0–0.5)
Eosinophils Relative: 0 %
HCT: 39.7 % (ref 36.0–46.0)
Hemoglobin: 12.8 g/dL (ref 12.0–15.0)
IMMATURE GRANULOCYTES: 0 %
Lymphocytes Relative: 17 %
Lymphs Abs: 2.1 10*3/uL (ref 0.7–4.0)
MCH: 25.7 pg — ABNORMAL LOW (ref 26.0–34.0)
MCHC: 32.2 g/dL (ref 30.0–36.0)
MCV: 79.7 fL — ABNORMAL LOW (ref 80.0–100.0)
Monocytes Absolute: 0.6 10*3/uL (ref 0.1–1.0)
Monocytes Relative: 5 %
NEUTROS ABS: 9.5 10*3/uL — AB (ref 1.7–7.7)
NEUTROS PCT: 78 %
NRBC: 0 % (ref 0.0–0.2)
PLATELETS: 312 10*3/uL (ref 150–400)
RBC: 4.98 MIL/uL (ref 3.87–5.11)
RDW: 14.3 % (ref 11.5–15.5)
WBC: 12.4 10*3/uL — ABNORMAL HIGH (ref 4.0–10.5)

## 2018-05-01 LAB — POCT PREGNANCY, URINE: Preg Test, Ur: NEGATIVE

## 2018-05-01 LAB — TSH: TSH: 1.642 u[IU]/mL (ref 0.350–4.500)

## 2018-05-01 MED ORDER — DIAZEPAM 5 MG PO TABS
10.0000 mg | ORAL_TABLET | Freq: Once | ORAL | Status: AC
  Administered 2018-05-01: 10 mg via ORAL
  Filled 2018-05-01: qty 2

## 2018-05-01 NOTE — ED Provider Notes (Signed)
[facility] Emergency Department Provider Note       Time seen: ----------------------------------------- 6:31 PM on 05/01/2018 -----------------------------------------   I have reviewed the triage vital signs and the nursing notes.  HISTORY   Chief Complaint shock like pains    HPI Samantha Good is a 38 y.o. female with a history of anxiety, GERD and hypertension who presents to the ED for shocklike sensations that started from the left side of her head and go all the way down her feet.  These of occurred multiple times since 4 PM.  She has never had this sensation before.  It only seems to involve the left side.  She feels like she is urinating herself when this occurs although this is not actually occurring.  She denies any actual headache, denies any recent trauma.  Past Medical History:  Diagnosis Date  . Anxiety   . GERD (gastroesophageal reflux disease)   . Hypertension     There are no active problems to display for this patient.   Past Surgical History:  Procedure Laterality Date  . ESOPHAGOGASTRODUODENOSCOPY (EGD) WITH PROPOFOL N/A 06/07/2015   Procedure: ESOPHAGOGASTRODUODENOSCOPY (EGD) WITH PROPOFOL;  Surgeon: Manya Silvas, MD;  Location: [facility] ENDOSCOPY;  Service: Endoscopy;  Laterality: N/A;  . KIDNEY SURGERY      Allergies Patient has no known allergies.  Social History Social History   Tobacco Use  . Smoking status: Never Smoker  . Smokeless tobacco: Never Used  Substance Use Topics  . Alcohol use: No  . Drug use: No   Review of Systems Constitutional: Negative for fever. Cardiovascular: Negative for chest pain. Respiratory: Negative for shortness of breath. Gastrointestinal: Negative for abdominal pain, vomiting and diarrhea. Musculoskeletal: Negative for back pain. Skin: Negative for rash. Neurological: Negative for headaches, positive for paresthesias  All systems negative/normal/unremarkable except as stated  in the HPI  ____________________________________________   PHYSICAL EXAM:  VITAL SIGNS: ED Triage Vitals  Enc Vitals Group     BP 05/01/18 1719 (!) 155/97     Pulse Rate 05/01/18 1719 (!) 129     Resp 05/01/18 1719 18     Temp 05/01/18 1719 98.1 F (36.7 C)     Temp Source 05/01/18 1719 Oral     SpO2 05/01/18 1719 100 %     Weight 05/01/18 1721 205 lb (93 kg)     Height --      Head Circumference --      Peak Flow --      Pain Score 05/01/18 1720 0     Pain Loc --      Pain Edu? --      Excl. in [place]? --    Constitutional: Alert and oriented. Well appearing and in no distress. Eyes: Conjunctivae are normal. Normal extraocular movements.  Equals equal round and reactive to light ENT      Head: Normocephalic and atraumatic.      Nose: No congestion/rhinnorhea.      Mouth/Throat: Mucous membranes are moist.      Neck: No stridor. Cardiovascular: Normal rate, regular rhythm. No murmurs, rubs, or gallops. Respiratory: Normal respiratory effort without tachypnea nor retractions. Breath sounds are clear and equal bilaterally. No wheezes/rales/rhonchi. Gastrointestinal: Soft and nontender. Normal bowel sounds Musculoskeletal: Nontender with normal range of motion in extremities. No lower extremity tenderness nor edema. Neurologic:  Normal speech and language. No gross focal neurologic deficits are appreciated.  Strength, sensation, cranial nerves, finger-to-nose testing are all normal Skin:  Skin  is warm, dry and intact. No rash noted. Psychiatric: Anxious ____________________________________________  ED COURSE:  As part of my medical decision making, I reviewed the following data within the [place] History obtained from family if available, nursing notes, old chart and ekg, as well as notes from prior ED visits. Patient presented for shocklike sensations, we will assess with labs as indicated at this time.    Procedures ____________________________________________   LABS (pertinent positives/negatives)  Labs Reviewed  CBC WITH DIFFERENTIAL/PLATELET - Abnormal; Notable for the following components:      Result Value   WBC 12.4 (*)    MCV 79.7 (*)    MCH 25.7 (*)    Neutro Abs 9.5 (*)    All other components within normal limits  COMPREHENSIVE METABOLIC PANEL - Abnormal; Notable for the following components:   Glucose, Bld 109 (*)    All other components within normal limits  URINALYSIS, COMPLETE (UACMP) WITH MICROSCOPIC - Abnormal; Notable for the following components:   Color, Urine STRAW (*)    APPearance CLEAR (*)    All other components within normal limits  TSH  POC URINE PREG, ED  POCT PREGNANCY, URINE     ____________________________________________   DIFFERENTIAL DIAGNOSIS   Anxiety, depression, trigeminal neuralgia, paresthesia  FINAL ASSESSMENT AND PLAN  Paresthesia   Plan: The patient had presented for paresthesias down the left side of her body. Patient's labs were reassuring, she is cleared for outpatient follow-up with her doctor.Laurence Aly, MD    Note: This note was generated in part or whole with voice recognition software. Voice recognition is usually quite accurate but there are transcription errors that can and very often do occur. I apologize for any typographical errors that were not detected and corrected.     Earleen Newport, MD 05/01/18 [phone]

## 2018-05-01 NOTE — ED Triage Notes (Signed)
Pt is here for shock like sensations that she has had since 4pm.  She states that she had 6-10 episodes of electric shock type sensation that came suddenly while at rest and went from her head down through her body on her left side.  She sates that she feels like she is peeing herself when this occurs (but no actual incontinence with this).  No recent trauma.  No pain with this, she states that it is not a pain, it is a feeling of electric shock and cold and her left side of her head "feels full". Pt is alert and oriented, no focal neuro deficit.

## 2018-05-01 NOTE — ED Notes (Signed)
ED Provider at bedside. 

## 2018-10-25 ENCOUNTER — Emergency Department: Payer: 59

## 2018-10-25 ENCOUNTER — Other Ambulatory Visit: Payer: Self-pay

## 2018-10-25 ENCOUNTER — Emergency Department
Admission: EM | Admit: 2018-10-25 | Discharge: 2018-10-25 | Disposition: A | Discharge status: Home / Self Care | Payer: 59 | Attending: Emergency Medicine | Admitting: Emergency Medicine

## 2018-10-25 DIAGNOSIS — Z79899 Other long term (current) drug therapy: Secondary | ICD-10-CM | POA: Insufficient documentation

## 2018-10-25 DIAGNOSIS — M79661 Pain in right lower leg: Secondary | ICD-10-CM | POA: Diagnosis present

## 2018-10-25 DIAGNOSIS — M5417 Radiculopathy, lumbosacral region: Secondary | ICD-10-CM | POA: Insufficient documentation

## 2018-10-25 DIAGNOSIS — I10 Essential (primary) hypertension: Secondary | ICD-10-CM | POA: Insufficient documentation

## 2018-10-25 MED ORDER — METHOCARBAMOL 500 MG PO TABS: 500.0000 mg | ORAL_TABLET | Freq: Three times a day (TID) | ORAL | 0 refills | Status: AC | PRN

## 2018-10-25 MED ORDER — PREDNISONE 10 MG (21) PO TBPK: ORAL_TABLET | ORAL | 0 refills | Status: DC

## 2018-10-25 NOTE — ED Triage Notes (Addendum)
Pt comes with c/o right leg pain. Pt states this started about a week ago. Pt states pain when she pushes down on gas pedal.  Pt denies any recent injuries or long trips.

## 2018-10-25 NOTE — ED Provider Notes (Signed)
[facility] Emergency Department Provider Note ____________________________________________  Time seen: Approximately 7:12 PM  I have reviewed the triage vital signs and the nursing notes.   HISTORY  Chief Complaint Leg Pain    HPI Samantha Good is a 38 y.o. female who presents to the emergency department for evaluation and treatment of right leg pain started approximately 1 week ago.  Pain increases with pressing down on gas pedal or ambulating.  She is not currently taking any hormone replacement or her on any birth control, she does not have a history of DVT, she has not traveled or been sedentary for long periods of time, and she denies any immobilization.  She went to urgent care and was advised that her right calf is larger than her left calf.  She denies noting any swelling or erythema since onset of pain.  She denies chest pain or shortness of breath.  Past Medical History:  Diagnosis Date  . Anxiety   . GERD (gastroesophageal reflux disease)   . Hypertension     There are no active problems to display for this patient.   Past Surgical History:  Procedure Laterality Date  . ESOPHAGOGASTRODUODENOSCOPY (EGD) WITH PROPOFOL N/A 06/07/2015   Procedure: ESOPHAGOGASTRODUODENOSCOPY (EGD) WITH PROPOFOL;  Surgeon: Manya Silvas, MD;  Location: [facility] ENDOSCOPY;  Service: Endoscopy;  Laterality: N/A;  . KIDNEY SURGERY      Prior to Admission medications   Medication Sig Start Date End Date Taking? Authorizing Provider  ALPRAZolam Duanne Moron) 0.5 MG tablet Take 0.5 mg by mouth at bedtime as needed for anxiety.    [provider]  buPROPion (WELLBUTRIN XL) 150 MG 24 hr tablet Take 150 mg by mouth daily.    [provider]  doxycycline (MONODOX) 100 MG capsule Take 100 mg by mouth 2 (two) times daily. Reported on 06/07/2015    [provider]  lisinopril (PRINIVIL,ZESTRIL) 20 MG tablet Take 20 mg by mouth daily.    [provider]  sertraline (ZOLOFT) 100 MG tablet Take 100 mg by mouth daily.    [provider]    Allergies Patient has no known allergies.  No family history on file.  Social History Social History   Tobacco Use  . Smoking status: Never Smoker  . Smokeless tobacco: Never Used  Substance Use Topics  . Alcohol use: No  . Drug use: No    Review of Systems Constitutional: Negative for fever. Cardiovascular: Negative for chest pain. Respiratory: Negative for shortness of breath. Musculoskeletal: Positive for right calf pain. Skin: Negative for erythema, or open wounds or lesions.  Neurological: Negative for decrease in sensation  ____________________________________________   PHYSICAL EXAM:  VITAL SIGNS: ED Triage Vitals  Enc Vitals Group     BP 10/25/18 1849 (!) 157/96     Pulse Rate 10/25/18 1849 (!) 122     Resp 10/25/18 1849 19     Temp 10/25/18 1849 98.8 F (37.1 C)     Temp src --      SpO2 10/25/18 1849 97 %     Weight 10/25/18 1850 210 lb (95.3 kg)     Height 10/25/18 1850 5\' 1"  (1.549 m)     Head Circumference --      Peak Flow --      Pain Score 10/25/18 1850 5     Pain Loc --      Pain Edu? --      Excl. in [place]? --  Constitutional: Alert and oriented. Well appearing and in no acute distress. Eyes: Conjunctivae are clear without discharge or drainage Head: Atraumatic Neck: Supple. Respiratory: No cough. Respirations are even and unlabored. Musculoskeletal: Negative Homan's Sign.  Dorsalis pedis and posterior tibial pulses are 2+.  Patient is able to flex and extend the knee without pain.  She is able to flex and extend the ankle without inducing calf pain.  No obvious swelling of the right calf. Neurologic: Motor and sensory function is intact. Skin: Skin overlying the right calf is not erythematous.  There are some deeply palpable nodules in the right calf. Psychiatric: Affect and behavior are  appropriate.  ____________________________________________   LABS (all labs ordered are listed, but only abnormal results are displayed)  Labs Reviewed - No data to display ____________________________________________  RADIOLOGY  [place] pending. ____________________________________________   PROCEDURES  Procedures  ____________________________________________   INITIAL IMPRESSION / ASSESSMENT AND PLAN / ED COURSE  Samantha Good is a 38 y.o. who presents to the emergency department for evaluation of right calf pain.  She was sent here from urgent care to rule out DVT.  She is low risk.  Ultrasound of the right lower extremity will be conducted.  Patient care transferred to [facility], PA-C. Still awaiting [place].  Medications - No data to display  Pertinent labs & imaging results that were available during my care of the patient were reviewed by me and considered in my medical decision making (see chart for details).  _________________________________________   FINAL CLINICAL IMPRESSION(S) / ED DIAGNOSES  Final diagnoses:  Right calf pain    ED Discharge Orders    None       If controlled substance prescribed during this visit, 12 month history viewed on the [facility] prior to issuing an initial prescription for Schedule II or III opiod.   Victorino Dike, FNP 10/25/18 2016    Nena Polio, MD 10/25/18 [phone]

## 2018-10-25 NOTE — ED Provider Notes (Signed)
  Physical Exam  BP (!) 157/96   Pulse (!) 122 Comment: pt states she is nervous  Temp 98.8 F (37.1 C)   Resp 19   Ht 5\' 1"  (1.549 m)   Wt 95.3 kg   LMP 09/30/2018   SpO2 97%   BMI 39.68 kg/m   Physical Exam  ED Course/Procedures     Procedures  MDM   Assumed care for Samantha Merles, NP.  Venous ultrasound of the right lower extremity revealed no evidence of DVT.  I personally evaluated patient and symptoms are consistent with radiculopathy.  Patient was discharged with a taper steroid and Robaxin.  She was advised to follow-up with primary care as needed.  All patient questions were answered.      Vallarie Mare [place], PA-C 10/25/18 2210    Nance Pear, MD 10/25/18 2258

## 2019-01-24 ENCOUNTER — Emergency Department
Admission: EM | Admit: 2019-01-24 | Discharge: 2019-01-24 | Disposition: A | Discharge status: Home / Self Care | Payer: 59 | Attending: Emergency Medicine | Admitting: Emergency Medicine

## 2019-01-24 ENCOUNTER — Other Ambulatory Visit: Payer: Self-pay

## 2019-01-24 ENCOUNTER — Emergency Department: Payer: 59

## 2019-01-24 ENCOUNTER — Encounter: Payer: Self-pay | Admitting: Emergency Medicine

## 2019-01-24 DIAGNOSIS — F419 Anxiety disorder, unspecified: Secondary | ICD-10-CM | POA: Insufficient documentation

## 2019-01-24 DIAGNOSIS — I10 Essential (primary) hypertension: Secondary | ICD-10-CM

## 2019-01-24 LAB — CBC
HCT: 37.2 % (ref 36.0–46.0)
Hemoglobin: 12.2 g/dL (ref 12.0–15.0)
MCH: 25.6 pg — ABNORMAL LOW (ref 26.0–34.0)
MCHC: 32.8 g/dL (ref 30.0–36.0)
MCV: 78.2 fL — ABNORMAL LOW (ref 80.0–100.0)
Platelets: 324 10*3/uL (ref 150–400)
RBC: 4.76 MIL/uL (ref 3.87–5.11)
RDW: 13.5 % (ref 11.5–15.5)
WBC: 11 10*3/uL — ABNORMAL HIGH (ref 4.0–10.5)
nRBC: 0 % (ref 0.0–0.2)

## 2019-01-24 LAB — COMPREHENSIVE METABOLIC PANEL
ALT: 14 U/L (ref 0–44)
AST: 16 U/L (ref 15–41)
Albumin: 4.4 g/dL (ref 3.5–5.0)
Alkaline Phosphatase: 71 U/L (ref 38–126)
Anion gap: 12 (ref 5–15)
BUN: 23 mg/dL — ABNORMAL HIGH (ref 6–20)
CO2: 21 mmol/L — ABNORMAL LOW (ref 22–32)
Calcium: 9.8 mg/dL (ref 8.9–10.3)
Chloride: 106 mmol/L (ref 98–111)
Creatinine, Ser: 0.61 mg/dL (ref 0.44–1.00)
GFR calc Af Amer: >60 mL/min (ref >60)
GFR calc non Af Amer: >60 mL/min (ref >60)
Glucose, Bld: 122 mg/dL — ABNORMAL HIGH (ref 70–99)
Potassium: 3.5 mmol/L (ref 3.5–5.1)
Sodium: 139 mmol/L (ref 135–145)
Total Bilirubin: 0.6 mg/dL (ref 0.3–1.2)
Total Protein: 8.1 g/dL (ref 6.5–8.1)

## 2019-01-24 LAB — TROPONIN I (HIGH SENSITIVITY): Troponin I (High Sensitivity): 3 ng/L (ref <18)

## 2019-01-24 LAB — T4, FREE: Free T4: 0.78 ng/dL (ref 0.61–1.12)

## 2019-01-24 LAB — TSH: TSH: 1.085 u[IU]/mL (ref 0.350–4.500)

## 2019-01-24 MED ORDER — DIAZEPAM 5 MG PO TABS
10.0000 mg | ORAL_TABLET | Freq: Once | ORAL | Status: AC
  Administered 2019-01-24: 16:00:00 10 mg via ORAL
  Filled 2019-01-24: qty 2

## 2019-01-24 MED ORDER — IBUPROFEN 800 MG PO TABS
800.0000 mg | ORAL_TABLET | Freq: Once | ORAL | Status: AC
  Administered 2019-01-24: 16:00:00 800 mg via ORAL

## 2019-01-24 NOTE — ED Notes (Signed)

## 2019-01-24 NOTE — ED Triage Notes (Signed)
Pt presents to ED c/o hypertension. Pt states she took her lisinopril and xanax prior to EMS arrival. BP with EMS 187/110. Pt appears very anxious, states 2020 has been a hard year for her. Tearful while talking to MD. C/o headache 6/10. No other complaints.

## 2019-01-24 NOTE — ED Provider Notes (Signed)
[facility] Emergency Department Provider Note       Time seen: ----------------------------------------- 4:13 PM on 01/24/2019 -----------------------------------------   I have reviewed the triage vital signs and the nursing notes.  HISTORY   Chief Complaint No chief complaint on file.    HPI Samantha Good is a 38 y.o. female with a history of anxiety, GERD, hypertension who presents to the ED for elevated blood pressure and anxiety.  Patient reports her blood pressure has been markedly elevated into the 180s over 110s.  Recently BuSpar was increased to 20 mg.  She does have a history of anxiety.  She denies any recent illness.  Was having some facial numbness.  Past Medical History:  Diagnosis Date  . Anxiety   . GERD (gastroesophageal reflux disease)   . Hypertension     There are no active problems to display for this patient.   Past Surgical History:  Procedure Laterality Date  . ESOPHAGOGASTRODUODENOSCOPY (EGD) WITH PROPOFOL N/A 06/07/2015   Procedure: ESOPHAGOGASTRODUODENOSCOPY (EGD) WITH PROPOFOL;  Surgeon: Manya Silvas, MD;  Location: [facility] ENDOSCOPY;  Service: Endoscopy;  Laterality: N/A;  . KIDNEY SURGERY      Allergies Patient has no known allergies.  Social History Social History   Tobacco Use  . Smoking status: Never Smoker  . Smokeless tobacco: Never Used  Substance Use Topics  . Alcohol use: No  . Drug use: No    Review of Systems Constitutional: Negative for fever. Cardiovascular: Negative for chest pain. Respiratory: Negative for shortness of breath. Gastrointestinal: Negative for abdominal pain, vomiting and diarrhea. Musculoskeletal: Negative for back pain. Skin: Negative for rash. Neurological: Negative for headaches, positive for paresthesias Psychiatric: Positive for anxiety  All systems negative/normal/unremarkable except as stated in the HPI  ____________________________________________   PHYSICAL  EXAM:  VITAL SIGNS: ED Triage Vitals  Enc Vitals Group     BP      Pulse      Resp      Temp      Temp src      SpO2      Weight      Height      Head Circumference      Peak Flow      Pain Score      Pain Loc      Pain Edu?      Excl. in [place]?    Constitutional: Alert and oriented. Well appearing and in no distress. Eyes: Conjunctivae are normal. Normal extraocular movements. Cardiovascular: Normal rate, regular rhythm. No murmurs, rubs, or gallops. Respiratory: Normal respiratory effort without tachypnea nor retractions. Breath sounds are clear and equal bilaterally. No wheezes/rales/rhonchi. Gastrointestinal: Soft and nontender. Normal bowel sounds Musculoskeletal: Nontender with normal range of motion in extremities. No lower extremity tenderness nor edema. Neurologic:  Normal speech and language. No gross focal neurologic deficits are appreciated.  Skin:  Skin is warm, dry and intact. No rash noted. Psychiatric: Mood and affect are normal. Speech and behavior are normal.  ____________________________________________  EKG: Interpreted by me.  Sinus tachycardia with rate of 120 bpm, leftward axis, normal QT  ____________________________________________  ED COURSE:  As part of my medical decision making, I reviewed the following data within the [place] History obtained from family if available, nursing notes, old chart and ekg, as well as notes from prior ED visits. Patient presented for hypertension and anxiety, we will assess with labs and imaging as indicated at this time.   Procedures  Samantha Good was evaluated in Emergency Department on 01/24/2019 for the symptoms described in the history of present illness. She was evaluated in the context of the global COVID-19 pandemic, which necessitated consideration that the patient might be at risk for infection with the SARS-CoV-2 virus that causes COVID-19. Institutional protocols and algorithms that  pertain to the evaluation of patients at risk for COVID-19 are in a state of rapid change based on information released by regulatory bodies including the CDC and federal and state organizations. These policies and algorithms were followed during the patient's care in the ED.  ____________________________________________   LABS (pertinent positives/negatives)  Labs Reviewed  CBC - Abnormal; Notable for the following components:      Result Value   WBC 11.0 (*)    MCV 78.2 (*)    MCH 25.6 (*)    All other components within normal limits  COMPREHENSIVE METABOLIC PANEL - Abnormal; Notable for the following components:   CO2 21 (*)    Glucose, Bld 122 (*)    BUN 23 (*)    All other components within normal limits  TSH  T4, FREE  TROPONIN I (HIGH SENSITIVITY)    RADIOLOGY Images were viewed by me  Chest x-ray IMPRESSION:  No acute cardiopulmonary abnormality.  ____________________________________________   DIFFERENTIAL DIAGNOSIS   Anxiety, panic attack, essential hypertension, hyperthyroidism, dehydration, electrolyte abnormality  FINAL ASSESSMENT AND PLAN  Hypertension, anxiety   Plan: The patient had presented for elevated blood pressure and anxiety. Patient's labs were unremarkable including her thyroid testing. Patient's imaging was reassuring as well.  She currently feels better after taking Valium and blood pressure has improved.  Blood pressure appears to be related to her anxiety level.  We may try some anxiolytics to take at home.  Otherwise she appears cleared for outpatient follow-up.   Laurence Aly, MD    Note: This note was generated in part or whole with voice recognition software. Voice recognition is usually quite accurate but there are transcription errors that can and very often do occur. I apologize for any typographical errors that were not detected and corrected.     Earleen Newport, MD 01/24/19 [phone]

## 2019-05-28 ENCOUNTER — Other Ambulatory Visit: Payer: Self-pay | Admitting: Nurse Practitioner

## 2019-05-28 DIAGNOSIS — N644 Mastodynia: Secondary | ICD-10-CM

## 2019-05-28 DIAGNOSIS — N63 Unspecified lump in unspecified breast: Secondary | ICD-10-CM

## 2019-06-06 ENCOUNTER — Ambulatory Visit
Admission: RE | Admit: 2019-06-06 | Discharge: 2019-06-06 | Disposition: A | Discharge status: Home / Self Care | Payer: 59 | Source: Ambulatory Visit | Attending: Nurse Practitioner | Admitting: Nurse Practitioner

## 2019-06-06 DIAGNOSIS — N644 Mastodynia: Secondary | ICD-10-CM | POA: Diagnosis present

## 2019-06-06 DIAGNOSIS — N63 Unspecified lump in unspecified breast: Secondary | ICD-10-CM | POA: Insufficient documentation

## 2020-04-23 ENCOUNTER — Other Ambulatory Visit: Payer: Self-pay | Admitting: Nurse Practitioner

## 2020-04-23 DIAGNOSIS — Z1231 Encounter for screening mammogram for malignant neoplasm of breast: Secondary | ICD-10-CM

## 2021-04-26 ENCOUNTER — Other Ambulatory Visit: Payer: Self-pay | Admitting: Nurse Practitioner

## 2021-04-26 DIAGNOSIS — Z1231 Encounter for screening mammogram for malignant neoplasm of breast: Secondary | ICD-10-CM

## 2021-06-03 ENCOUNTER — Ambulatory Visit
Admission: RE | Admit: 2021-06-03 | Discharge: 2021-06-03 | Disposition: A | Discharge status: Home / Self Care | Payer: 59 | Source: Ambulatory Visit | Attending: Nurse Practitioner | Admitting: Nurse Practitioner

## 2021-06-03 DIAGNOSIS — Z1231 Encounter for screening mammogram for malignant neoplasm of breast: Secondary | ICD-10-CM | POA: Diagnosis present

## 2022-05-08 ENCOUNTER — Other Ambulatory Visit: Payer: Self-pay | Admitting: Nurse Practitioner

## 2022-05-08 DIAGNOSIS — Z1231 Encounter for screening mammogram for malignant neoplasm of breast: Secondary | ICD-10-CM

## 2022-06-09 ENCOUNTER — Ambulatory Visit
Admission: RE | Admit: 2022-06-09 | Discharge: 2022-06-09 | Disposition: A | Discharge status: Home / Self Care | Payer: Managed Care, Other (non HMO) | Source: Ambulatory Visit | Attending: Nurse Practitioner | Admitting: Nurse Practitioner

## 2022-06-09 DIAGNOSIS — Z1231 Encounter for screening mammogram for malignant neoplasm of breast: Secondary | ICD-10-CM | POA: Diagnosis present

## 2023-05-17 ENCOUNTER — Other Ambulatory Visit: Payer: Self-pay | Admitting: Nurse Practitioner

## 2023-05-17 DIAGNOSIS — Z1231 Encounter for screening mammogram for malignant neoplasm of breast: Secondary | ICD-10-CM

## 2023-09-10 NOTE — H&P (Signed)
 GYN H&P   CC: AUB   HPI: Samantha Good 43 y.o. [phone] with a hx of obesity, anemia, depression, HTN, fibromyalgia, HLD, and T2DM (A1c 6.2 on 05/17/23) who presents for follow-up of AUB and for pre-op visit.   Scheduled for [facility] BS and cysto on 09/21/23.  Last seen on 08/24/23 and reported daily heavy bleeding on Provera 10 mg daily. Increased Provera to BID. Today reports increased to BID which stopped bleeding. No further bleeding since.   Has not started vibegron yet- plans to start after surgery.  AUB history: Reports regular monthly menses- bleeds for 7-8d every month. 4 days are heavy (changing soaked tampon q1h). Reports cramping starts a few days prior to bleeding and stops after first day of bleeding. This has been for the past year. Prior to that regular monthly menses with 3-4d of bleeding that were much lighter. Reports spotting every day for the past month- brown or black. Also has been cramping every day for the past month. Reports worsening PMS symptoms starting a few days before menses and continues through menses- anxiety, nausea, emotional, mood swings, and fatigue. PMS symptoms are new.   Has tried:  Depo- gained 40 lbs OCPs- couldn't remember to take Nexplanon- had problems but cannot remember, had it taken out early Mirena IUD- it got dislodged and was causing issues so had to have it taken out  Not exercising currently.  PCP: SARAH KATHRYN GAUGER, NP  ROS: All other systems reviewed and negative   PMHx: Past Medical History:  Diagnosis Date   COVID-19 05/13/2019   COVID-19 03/30/2021   Depression with anxiety 04/12/2016   Fibromyalgia 08/13/2017   Gastritis determined by endoscopy    Gastroesophageal reflux disease with esophagitis 11/30/2017   GERD (gastroesophageal reflux disease)    Hypertension    IDA (iron deficiency anemia) 12/18/2017   IGT (impaired glucose tolerance) 07/2016   A1c 5.9%.   Mixed stress and urge incontinence 08/24/2023   Reports stress  incontinence- few times per months. Voids 5-10x during the day and 2x overnight. +Urgency and occasional urge incontinence- 1-2x per month. Denies incomplete emptying, hesitancy, dysuria, and hematuria. Reports urge to urinate with intercourse.    Obesity, Class III, BMI 40-49.9 (morbid obesity)    Recurrent vaginitis 04/12/2016   Slow transit constipation 11/30/2017   Type 2 diabetes mellitus (CMS/HHS-HCC) 10/26/2021      PSHx: Past Surgical History:  Procedure Laterality Date   Laparoscopic Kidney Surgery  2012   Does not know which side. States a ureter was being pinched so they had to reroute it.   EGD  06/07/2015   Gastritis, Esophagitis: No repeat per RTE   COLONOSCOPY  01/17/2018   Internal hemorrhoids: CBF 01/2028   EGD  01/17/2018   Gastritis: No repeat per RTE     OBHx: OB History  Gravida Para Term Preterm AB Living  3 3 3   3   SAB IAB Ectopic Molar Multiple Live Births           # Outcome Date GA Lbr Len/2nd Weight Sex Type Anes PTL Lv  3 Term      Vag-Spont     2 Term      Vag-Spont     1 Term      Vag-Spont       Obstetric Comments  Vaginal deliveries     GYN Hx: - LMP: Patient's last menstrual period was 08/21/2023 (exact date).  - Menarche: Age 74 - Menses: see HPI - Pap  hx: Denies any history of abnormals, last 04/22/20 NILM. Never had any excisional procedures - STI hx: Denies any history of STIs - Sexual preference: Sexually active with husband - Dyspareunia or sexual concerns: no - Contraceptive method: husband vasectomy - Fertility desires: satisfied parity - HPV vaccination: did not receive - Abdominal surgeries: none - GYN procedures: none    FHx: Denies FHx of ovarian, breast, uterine, cervical, and colon cancer   Meds: Current Outpatient Medications on File Prior to Visit  Medication Sig Dispense Refill   ALPRAZolam (XANAX) 1 MG tablet Take 1 mg by mouth once daily as needed     amLODIPine (NORVASC) 10 MG tablet TAKE 1 TABLET BY MOUTH  EVERY DAY 90 tablet 1   ARIPiprazole (ABILIFY) 5 MG tablet Take 1 tablet by mouth once daily     busPIRone (BUSPAR) 15 MG tablet TAKE 1 TABLET BY MOUTH TWICE DAILY 180 tablet 1   carvediloL (COREG) 12.5 MG tablet Take 1 tablet (12.5 mg total) by mouth 2 (two) times daily with meals 60 tablet 11   diazePAM  (VALIUM ) 2 MG tablet Take 30 minutes prior to procedure 1 tablet 0   lisinopriL (ZESTRIL) 40 MG tablet Take 1 tablet (40 mg total) by mouth once daily 90 tablet 1   medroxyPROGESTERone (PROVERA) 10 MG tablet Take 1 tablet (10 mg total) by mouth 2 (two) times daily for 30 days Provera 10 mg  1 tablet daily x 10 days 60 tablet 0   metFORMIN (GLUCOPHAGE-XR) 500 MG XR tablet TAKE 1 TABLET BY MOUTH EVERY DAY 90 tablet 1   pantoprazole (PROTONIX) 40 MG DR tablet TAKE 1 TABLET BY MOUTH EVERY DAY 90 tablet 1   rosuvastatin (CRESTOR) 10 MG tablet Take 1 tablet (10 mg total) by mouth once daily 30 tablet 11   sertraline (ZOLOFT) 100 MG tablet TAKE 2 TABLETS BY MOUTH ONCE DAILY 180 tablet 1   sertraline (ZOLOFT) 50 MG tablet TAKE ONE TABLET ALONG WITH A 200 MG DOSE ONE WEEK PRIOR TO THE EXPECTED MENSTRUATION FOR PMDD     vibegron 75 mg Tab Take 1 tablet by mouth once daily for 90 days 90 tablet 0   No current facility-administered medications on file prior to visit.     Allergies: Allergies  Allergen Reactions   Wellbutrin [Bupropion Hcl] Anxiety     SocHx: Social History   Tobacco Use   Smoking status: Former    Current packs/day: 0.00    Types: Cigarettes   Smokeless tobacco: Never   Tobacco comments:    43 yrs old and smoked for a couple of months.  Vaping Use   Vaping status: Never Used  Substance Use Topics   Alcohol use: No    Alcohol/week: 0.0 standard drinks of alcohol   Drug use: No     OBJECTIVE: LMP 08/21/2023 (Exact Date)    Gen: NAD HEENT: [place]/AT Heart: Regular rate Lungs: Normal work of breathing   Pelvic ultrasound- 08/15/23 Endovaginal  Imaging ================   Indication: Endovaginal imaging was necessary to evaluate the uterus and ovaries. Probe: [id number].   Uterus ======   Visualized. Size 86 mm x 58 mm x 45 mm Normal Position: anteverted Malformations: none Myometrium: appears normal Endometrium: Echogenic, focal thickening at the fundus. Endometrial thickness, total 2.0 mm Cervix details: The cervical length is 4.0cm. Fibroid(s) Subserosal: (5) >50% Intramural. Posterior. Round isoechoic intramural fibroid seen within anterior midline myometrium at the level of the        lower uterine segment.  Size 15.00 mm x 15.00 mm x 16 mm. Mean 15.3 mm. Vol 1.9 cm. Doppler: color score 2 (minimal color) Polyp(s)   Broad-based polyp. Fundal EMC. Smooth. Homogeneous structure. Size 12 mm x 10 mm x 11 mm. Mean 11.0 mm. Doppler: color score 2        (minimal color)   Right Ovary =========   Suboptimal, probably normal. Outline: Normal. Morphology: Appropriate. Size 33 mm x 36 mm x 29 mm No cysts identified No follicles identified   Right Tube =========   Not visualized   Left Ovary ========   Visualized, Normal. Outline: Normal. Morphology: Appropriate. Size 27 mm x 20 mm x 18 mm No cysts identified No follicles identified   Left Tube ========   Not visualized   Cul de Sac =========   Visualized. Free fluid visualized Largest pool 17.0 mm x 11.0 mm x 27.0 mm. Vol 2.644 ml   Bladder ======   Not examined   Impression =========   Endovaginal ultrasound performed today due to the indications outlined above.   Small round intramural fibroid seen within central anterior myometrium as noted above. The endometrium appears echogenic with clearly defined margins. Endometrium measures between 2-41mm with focal thickening seen at fundal aspect of endometrial canal measuring 11mm AP. Vascular stalk not clearly identified with color flow imaging. Suspected endometrial polyp.   The ovaries were visualized  bilaterally and appear normal.   There are no unusual adnexal findings.   Trace anechoic fluid seen within the posterior cul de sac.  ASSESSMENT/PLAN: Samantha Good 43 y.o. [phone] with a hx of obesity, anemia, depression, HTN, fibromyalgia, HLD, and T2DM (A1c 6.2 on 05/17/23) who presents for follow-up of AUB and for pre-op visit.    #AUB- menorrhagia and intermenstrual spotting #Endometrial polyp #Fibroid #Iron deficiency anemia - Exam 7 week size uterus, mobile, moderate descent - Pap collected 09/10/23 - EMB benign 08/24/23 - Hb 11.1 (05/17/23) > 10.2 (08/15/23) > 10.6 (08/24/23) - Continue iron supplement.  - Pelvic US  08/15/23: uterus 8.6x5.8x4.5cm, 1.5 cm subserosal anterior fibroid and 1.2 cm fundal broad based endometrial polyp, ovaries normal - TSH and CMP wnl on 05/17/23 - Continue Provera to 10 mg BID  #Pre-op - PMH: T2DM, HTN, anemia, depression - PSH: some type of kidney surgery many years ago- laparoscopic - 3 FTSVD - Up to date on health care maintenance (see below) - Patient not interested in retrying any hormonal methods and wants to proceed with surgical management. Patient is 100% sure she is done having children. She desires definitive management at this time via hysterectomy.  - Discussed need to keep diabetes and HTN well controlled and to work on nutrition and exercise to optimize health  - Surgical procedure: total vaginal hysterectomy, bilateral salpingectomy (if possible), and cystoscopy Fallopian tubes: Discussed option for ovarian cancer risk reducing salpingectomy, patient desires to have her fallopian tubes removed at the time of surgery if possible. Ovaries: Due to patient's pre-menopausal state and lack of risk factors for ovarian cancer, I recommend leaving ovaries at the time of surgery.  Discussed with patient that a vaginal approach would be the preferred route for hysterectomy.  - Plan for same day discharge - Medical pre op clearance is not needed -  Scheduled for [facility] BS and cystoscopy - Discussed planned procedure, risks, and benefits. Risks such as infection, bleeding, organ damage (including damage to ureters, bowel, and bladder), anesthesia complications, need to convert to laparotomy, and need for possible blood products were discussed; patient will  accept products in case of an emergency. We discussed the risks of a blood transfusion, such as a 1 in 1.2-1.4 million chance of contracting HIV, Hep C. Discussed plans for vaginal approach but there is also the possibility that we need to go laparoscopic to complete the procedure.  - Surgical and blood consents signed. - Pre-op orders placed. Plan for cefazolin and metronidazole for antibiotics - Discussed post-op lifting restrictions and pelvic rest - Post-op prescriptions (scheduled tylenol q8h, ibuprofen  q8h, oxycodone q6h PRN x 12 tabs, senna x 7d, miralax x 7d, zofran PRN) sent   Samantha Good DICIE DINSMORE, MD

## 2023-09-13 ENCOUNTER — Other Ambulatory Visit: Payer: Self-pay

## 2023-09-13 ENCOUNTER — Encounter
Admission: RE | Admit: 2023-09-13 | Discharge: 2023-09-13 | Disposition: A | Discharge status: Home / Self Care | Source: Ambulatory Visit | Attending: Obstetrics and Gynecology | Admitting: Obstetrics and Gynecology

## 2023-09-13 DIAGNOSIS — Z01812 Encounter for preprocedural laboratory examination: Secondary | ICD-10-CM

## 2023-09-13 DIAGNOSIS — E119 Type 2 diabetes mellitus without complications: Secondary | ICD-10-CM

## 2023-09-13 DIAGNOSIS — I10 Essential (primary) hypertension: Secondary | ICD-10-CM

## 2023-09-13 HISTORY — DX: Sleep apnea, unspecified: G47.30

## 2023-09-13 HISTORY — DX: Hyperlipidemia, unspecified: E78.5

## 2023-09-13 HISTORY — DX: Mixed incontinence: N39.46

## 2023-09-13 HISTORY — DX: Polyp of corpus uteri: N84.0

## 2023-09-13 HISTORY — DX: Fibromyalgia: M79.7

## 2023-09-13 HISTORY — DX: Type 2 diabetes mellitus without complications: E11.9

## 2023-09-13 HISTORY — DX: Iron deficiency anemia, unspecified: D50.9

## 2023-09-13 HISTORY — DX: Excessive and frequent menstruation with regular cycle: N92.0

## 2023-09-13 HISTORY — DX: Pneumonia, unspecified organism: J18.9

## 2023-09-13 HISTORY — DX: Depression, unspecified: F32.A

## 2023-09-13 NOTE — Patient Instructions (Addendum)
 Your procedure is scheduled on: 09/21/23 - Friday Report to the Registration Desk on the 1st floor of the [facility]. To find out your arrival time, please call [phone] between 1PM - 3PM on: 09/20/23 - Thursday If your arrival time is 6:00 am, do not arrive before that time as the [facility] entrance doors do not open until 6:00 am.  REMEMBER: Instructions that are not followed completely may result in serious medical risk, up to and including death; or upon the discretion of your surgeon and anesthesiologist your surgery may need to be rescheduled.  Do not eat food after midnight the night before surgery.  No gum chewing or hard candies.  You may however, drink CLEAR liquids up to 2 hours before you are scheduled to arrive for your surgery. Do not drink anything within 2 hours of your scheduled arrival time.  Clear liquids include: - water   In addition, your doctor has ordered for you to drink the provided:   Gatorade G2 Drinking this carbohydrate drink up to two hours before surgery helps to reduce insulin resistance and improve patient outcomes. Please complete drinking 2 hours before scheduled arrival time.  One week prior to surgery: Stop Anti-inflammatories (NSAIDS) such as Advil , Aleve, Ibuprofen , Motrin , Naproxen, Naprosyn and Aspirin based products such as Excedrin, Goody's Powder, BC Powder. You may take Tylenol if needed for pain up until the day of surgery.  Stop ANY OVER THE COUNTER supplements until after surgery : continue your ferrous sulfate  HOLD metFORMIN (GLUCOPHAGE-XR) beginning 09/19/23, may resume after surgery.  HOLD lisinopril (ZESTRIL) on the morning of surgery.  Continue taking all of your other prescription medications up until the day  Before surgery.  ON THE DAY OF SURGERY ONLY TAKE THESE MEDICATIONS WITH SIPS OF WATER:  amLODipine (NORVASC)  busPIRone (BUSPAR)  carvedilol (COREG)  pantoprazole (PROTONIX)  ARIPiprazole (ABILIFY)    No  Alcohol for 24 hours before or after surgery.  No Smoking including e-cigarettes for 24 hours before surgery.  No chewable tobacco products for at least 6 hours before surgery.  No nicotine patches on the day of surgery.  Do not use any recreational drugs for at least a week (preferably 2 weeks) before your surgery.  Please be advised that the combination of cocaine and anesthesia may have negative outcomes, up to and including death. If you test positive for cocaine, your surgery will be cancelled.  On the morning of surgery brush your teeth with toothpaste and water, you may rinse your mouth with mouthwash if you wish. Do not swallow any toothpaste or mouthwash.  Use CHG Soap or wipes as directed on instruction sheet.  Do not wear jewelry, make-up, hairpins, clips or nail polish.  For welded (permanent) jewelry: bracelets, anklets, waist bands, etc.  Please have this removed prior to surgery.  If it is not removed, there is a chance that hospital personnel will need to cut it off on the day of surgery.  Do not wear lotions, powders, or perfumes.   Do not shave body hair from the neck down 48 hours before surgery.  Contact lenses, hearing aids and dentures may not be worn into surgery.  Do not bring valuables to the hospital. [facility] is not responsible for any missing/lost belongings or valuables.   Notify your doctor if there is any change in your medical condition (cold, fever, infection).  Wear comfortable clothing (specific to your surgery type) to the hospital.  After surgery, you can help prevent  lung complications by doing breathing exercises.  Take deep breaths and cough every 1-2 hours. Your doctor may order a device called an Incentive Spirometer to help you take deep breaths.  When coughing or sneezing, hold a pillow firmly against your incision with both hands. This is called "splinting." Doing this helps protect your incision. It also decreases belly  discomfort.  If you are being admitted to the hospital overnight, leave your suitcase in the car. After surgery it may be brought to your room.  In case of increased patient census, it may be necessary for you, the patient, to continue your postoperative care in the Same Day Surgery department.  If you are being discharged the day of surgery, you will not be allowed to drive home. You will need a responsible individual to drive you home and stay with you for 24 hours after surgery.   If you are taking public transportation, you will need to have a responsible individual with you.  Please call the Pre-admissions Testing Dept. at [phone] if you have any questions about these instructions.  Surgery Visitation Policy:  Patients having surgery or a procedure may have two visitors.  Children under the age of 47 must have an adult with them who is not the patient.  Inpatient Visitation:    Visiting hours are 7 a.m. to 8 p.m. Up to four visitors are allowed at one time in a patient room. The visitors may rotate out with other people during the day.  One visitor age 21 or older may stay with the patient overnight and must be in the room by 8 p.m.   Merchandiser, retail to address health-related social needs:  [email or web address]     Preparing for Surgery with CHLORHEXIDINE GLUCONATE (CHG) Soap  Chlorhexidine Gluconate (CHG) Soap  o An antiseptic cleaner that kills germs and bonds with the skin to continue killing germs even after washing  o Used for showering the night before surgery and morning of surgery  Before surgery, you can play an important role by reducing the number of germs on your skin.  CHG (Chlorhexidine gluconate) soap is an antiseptic cleanser which kills germs and bonds with the skin to continue killing germs even after washing.  Please do not use if you have an allergy to CHG or antibacterial soaps. If your skin becomes reddened/irritated  stop using the CHG.  1. Shower the NIGHT BEFORE SURGERY and the MORNING OF SURGERY with CHG soap.  2. If you choose to wash your hair, wash your hair first as usual with your normal shampoo.  3. After shampooing, rinse your hair and body thoroughly to remove the shampoo.  4. Use CHG as you would any other liquid soap. You can apply CHG directly to the skin and wash gently with a scrungie or a clean washcloth.  5. Apply the CHG soap to your body only from the neck down. Do not use on open wounds or open sores. Avoid contact with your eyes, ears, mouth, and genitals (private parts). Wash face and genitals (private parts) with your normal soap.  6. Wash thoroughly, paying special attention to the area where your surgery will be performed.  7. Thoroughly rinse your body with warm water.  8. Do not shower/wash with your normal soap after using and rinsing off the CHG soap.  9. Pat yourself dry with a clean towel.  10. Wear clean pajamas to bed the night before surgery.  12. Place clean sheets on your bed the  night of your first shower and do not sleep with pets.  13. Shower again with the CHG soap on the day of surgery prior to arriving at the hospital.  14. Do not apply any deodorants/lotions/powders.  15. Please wear clean clothes to the hospital.

## 2023-09-17 ENCOUNTER — Encounter
Admission: RE | Admit: 2023-09-17 | Discharge: 2023-09-17 | Disposition: A | Discharge status: Home / Self Care | Source: Ambulatory Visit | Attending: Obstetrics and Gynecology | Admitting: Obstetrics and Gynecology

## 2023-09-17 DIAGNOSIS — Z01818 Encounter for other preprocedural examination: Secondary | ICD-10-CM | POA: Insufficient documentation

## 2023-09-17 DIAGNOSIS — I10 Essential (primary) hypertension: Secondary | ICD-10-CM | POA: Diagnosis not present

## 2023-09-17 DIAGNOSIS — Z01812 Encounter for preprocedural laboratory examination: Secondary | ICD-10-CM

## 2023-09-17 DIAGNOSIS — E119 Type 2 diabetes mellitus without complications: Secondary | ICD-10-CM | POA: Diagnosis not present

## 2023-09-17 LAB — CBC
HCT: 36.7 % (ref 36.0–46.0)
Hemoglobin: 11.3 g/dL — ABNORMAL LOW (ref 12.0–15.0)
MCH: 23.1 pg — ABNORMAL LOW (ref 26.0–34.0)
MCHC: 30.8 g/dL (ref 30.0–36.0)
MCV: 74.9 fL — ABNORMAL LOW (ref 80.0–100.0)
Platelets: 313 K/uL (ref 150–400)
RBC: 4.9 MIL/uL (ref 3.87–5.11)
RDW: 16.9 % — ABNORMAL HIGH (ref 11.5–15.5)
WBC: 6.9 K/uL (ref 4.0–10.5)
nRBC: 0 % (ref 0.0–0.2)

## 2023-09-17 LAB — TYPE AND SCREEN
ABO/RH(D): A POS
Antibody Screen: NEGATIVE

## 2023-09-17 LAB — BASIC METABOLIC PANEL WITH GFR
Anion gap: 10 (ref 5–15)
BUN: 14 mg/dL (ref 6–20)
CO2: 22 mmol/L (ref 22–32)
Calcium: 9.3 mg/dL (ref 8.9–10.3)
Chloride: 111 mmol/L (ref 98–111)
Creatinine, Ser: 0.7 mg/dL (ref 0.44–1.00)
GFR, Estimated: >60 mL/min (ref >60)
Glucose, Bld: 143 mg/dL — ABNORMAL HIGH (ref 70–99)
Potassium: 3.4 mmol/L — ABNORMAL LOW (ref 3.5–5.1)
Sodium: 143 mmol/L (ref 135–145)

## 2023-09-20 MED ORDER — CELECOXIB 200 MG PO CAPS
400.0000 mg | ORAL_CAPSULE | ORAL | Status: AC
  Administered 2023-09-21: 400 mg via ORAL

## 2023-09-20 MED ORDER — CEFAZOLIN SODIUM-DEXTROSE 2-4 GM/100ML-% IV SOLN
2.0000 g | INTRAVENOUS | Status: AC
  Administered 2023-09-21: 2 g via INTRAVENOUS

## 2023-09-20 MED ORDER — METRONIDAZOLE 500 MG/100ML IV SOLN
500.0000 mg | Freq: Once | INTRAVENOUS | Status: AC
  Administered 2023-09-21: 500 mg via INTRAVENOUS
  Filled 2023-09-20: qty 100

## 2023-09-20 MED ORDER — POVIDONE-IODINE 10 % EX SWAB
2.0000 | Freq: Once | CUTANEOUS | Status: AC
  Administered 2023-09-21: 2 via TOPICAL

## 2023-09-20 MED ORDER — CHLORHEXIDINE GLUCONATE 0.12 % MT SOLN
15.0000 mL | Freq: Once | OROMUCOSAL | Status: AC
  Administered 2023-09-21: 15 mL via OROMUCOSAL

## 2023-09-20 MED ORDER — SODIUM CHLORIDE 0.9 % IV SOLN: INTRAVENOUS | Status: DC

## 2023-09-20 MED ORDER — ACETAMINOPHEN 500 MG PO TABS
1000.0000 mg | ORAL_TABLET | ORAL | Status: AC
  Administered 2023-09-21: 1000 mg via ORAL

## 2023-09-20 MED ORDER — ORAL CARE MOUTH RINSE: 15.0000 mL | Freq: Once | OROMUCOSAL | Status: AC

## 2023-09-21 ENCOUNTER — Ambulatory Visit

## 2023-09-21 ENCOUNTER — Ambulatory Visit: Payer: Self-pay | Admitting: Urgent Care

## 2023-09-21 ENCOUNTER — Encounter: Payer: Self-pay | Admitting: Obstetrics and Gynecology

## 2023-09-21 ENCOUNTER — Ambulatory Visit
Admission: RE | Admit: 2023-09-21 | Discharge: 2023-09-21 | Disposition: A | Discharge status: Home / Self Care | Attending: Obstetrics and Gynecology | Admitting: Obstetrics and Gynecology

## 2023-09-21 ENCOUNTER — Encounter
Admission: RE | Disposition: A | Discharge status: Home / Self Care | Payer: Self-pay | Source: Home / Self Care | Attending: Obstetrics and Gynecology

## 2023-09-21 ENCOUNTER — Other Ambulatory Visit: Payer: Self-pay

## 2023-09-21 DIAGNOSIS — N939 Abnormal uterine and vaginal bleeding, unspecified: Secondary | ICD-10-CM | POA: Insufficient documentation

## 2023-09-21 DIAGNOSIS — F419 Anxiety disorder, unspecified: Secondary | ICD-10-CM | POA: Diagnosis not present

## 2023-09-21 DIAGNOSIS — Z7984 Long term (current) use of oral hypoglycemic drugs: Secondary | ICD-10-CM | POA: Diagnosis not present

## 2023-09-21 DIAGNOSIS — N921 Excessive and frequent menstruation with irregular cycle: Secondary | ICD-10-CM | POA: Insufficient documentation

## 2023-09-21 DIAGNOSIS — K219 Gastro-esophageal reflux disease without esophagitis: Secondary | ICD-10-CM | POA: Diagnosis not present

## 2023-09-21 DIAGNOSIS — F32A Depression, unspecified: Secondary | ICD-10-CM | POA: Diagnosis not present

## 2023-09-21 DIAGNOSIS — E785 Hyperlipidemia, unspecified: Secondary | ICD-10-CM | POA: Diagnosis not present

## 2023-09-21 DIAGNOSIS — E119 Type 2 diabetes mellitus without complications: Secondary | ICD-10-CM | POA: Diagnosis not present

## 2023-09-21 DIAGNOSIS — E66813 Obesity, class 3: Secondary | ICD-10-CM | POA: Diagnosis not present

## 2023-09-21 DIAGNOSIS — Z6841 Body Mass Index (BMI) 40.0 and over, adult: Secondary | ICD-10-CM | POA: Insufficient documentation

## 2023-09-21 DIAGNOSIS — N888 Other specified noninflammatory disorders of cervix uteri: Secondary | ICD-10-CM | POA: Insufficient documentation

## 2023-09-21 DIAGNOSIS — G4730 Sleep apnea, unspecified: Secondary | ICD-10-CM | POA: Insufficient documentation

## 2023-09-21 DIAGNOSIS — G709 Myoneural disorder, unspecified: Secondary | ICD-10-CM | POA: Diagnosis not present

## 2023-09-21 DIAGNOSIS — M797 Fibromyalgia: Secondary | ICD-10-CM | POA: Diagnosis not present

## 2023-09-21 DIAGNOSIS — I10 Essential (primary) hypertension: Secondary | ICD-10-CM | POA: Diagnosis not present

## 2023-09-21 DIAGNOSIS — D509 Iron deficiency anemia, unspecified: Secondary | ICD-10-CM | POA: Insufficient documentation

## 2023-09-21 DIAGNOSIS — Z01812 Encounter for preprocedural laboratory examination: Secondary | ICD-10-CM

## 2023-09-21 DIAGNOSIS — N838 Other noninflammatory disorders of ovary, fallopian tube and broad ligament: Secondary | ICD-10-CM | POA: Insufficient documentation

## 2023-09-21 DIAGNOSIS — Z01818 Encounter for other preprocedural examination: Secondary | ICD-10-CM

## 2023-09-21 HISTORY — PX: CYSTOSCOPY: SHX5120

## 2023-09-21 HISTORY — PX: HYSTERECTOMY, VAGINAL, WITH SALPINGECTOMY: SHX7588

## 2023-09-21 LAB — GLUCOSE, CAPILLARY
Glucose-Capillary: 116 mg/dL — ABNORMAL HIGH (ref 70–99)
Glucose-Capillary: 137 mg/dL — ABNORMAL HIGH (ref 70–99)

## 2023-09-21 LAB — POCT PREGNANCY, URINE: Preg Test, Ur: NEGATIVE

## 2023-09-21 SURGERY — HYSTERECTOMY, VAGINAL, WITH SALPINGECTOMY
Anesthesia: General | Site: Uterus

## 2023-09-21 MED ORDER — LIDOCAINE HCL (CARDIAC) PF 100 MG/5ML IV SOSY
PREFILLED_SYRINGE | INTRAVENOUS | Status: DC | PRN
  Administered 2023-09-21: 100 mg via INTRAVENOUS

## 2023-09-21 MED ORDER — LIDOCAINE HCL (PF) 2 % IJ SOLN
INTRAMUSCULAR | Status: AC
  Filled 2023-09-21: qty 5

## 2023-09-21 MED ORDER — MIDAZOLAM HCL 2 MG/2ML IJ SOLN
INTRAMUSCULAR | Status: AC
  Filled 2023-09-21: qty 2

## 2023-09-21 MED ORDER — LIDOCAINE-EPINEPHRINE 1 %-1:100000 IJ SOLN
INTRAMUSCULAR | Status: AC
Start: 2023-09-21 — End: 2023-09-21
  Filled 2023-09-21: qty 1

## 2023-09-21 MED ORDER — PROPOFOL 1000 MG/100ML IV EMUL
INTRAVENOUS | Status: AC
  Filled 2023-09-21: qty 100

## 2023-09-21 MED ORDER — PHENYLEPHRINE 80 MCG/ML (10ML) SYRINGE FOR IV PUSH (FOR BLOOD PRESSURE SUPPORT)
PREFILLED_SYRINGE | INTRAVENOUS | Status: DC | PRN
  Administered 2023-09-21: 80 ug via INTRAVENOUS
  Administered 2023-09-21: 160 ug via INTRAVENOUS
  Administered 2023-09-21: 80 ug via INTRAVENOUS

## 2023-09-21 MED ORDER — ROCURONIUM BROMIDE 10 MG/ML (PF) SYRINGE
PREFILLED_SYRINGE | INTRAVENOUS | Status: AC
  Filled 2023-09-21: qty 10

## 2023-09-21 MED ORDER — ACETAMINOPHEN 500 MG PO TABS
ORAL_TABLET | ORAL | Status: AC
Start: 2023-09-21 — End: 2023-09-21
  Filled 2023-09-21: qty 2

## 2023-09-21 MED ORDER — LIDOCAINE-EPINEPHRINE 1 %-1:100000 IJ SOLN
INTRAMUSCULAR | Status: DC | PRN
Start: 2023-09-21 — End: 2023-09-21
  Administered 2023-09-21: 20 mL

## 2023-09-21 MED ORDER — PROPOFOL 10 MG/ML IV BOLUS
INTRAVENOUS | Status: DC | PRN
  Administered 2023-09-21: 100 ug/kg/min via INTRAVENOUS
  Administered 2023-09-21: 200 mg via INTRAVENOUS
  Administered 2023-09-21: 150 ug/kg/min via INTRAVENOUS
  Administered 2023-09-21: 90 ug/kg/min via INTRAVENOUS
  Administered 2023-09-21: 125 ug/kg/min via INTRAVENOUS

## 2023-09-21 MED ORDER — DEXAMETHASONE SODIUM PHOSPHATE 10 MG/ML IJ SOLN
INTRAMUSCULAR | Status: DC | PRN
  Administered 2023-09-21: 10 mg via INTRAVENOUS

## 2023-09-21 MED ORDER — MIDAZOLAM HCL 2 MG/2ML IJ SOLN
INTRAMUSCULAR | Status: DC | PRN
  Administered 2023-09-21: 2 mg via INTRAVENOUS

## 2023-09-21 MED ORDER — CHLORHEXIDINE GLUCONATE 0.12 % MT SOLN
OROMUCOSAL | Status: AC
  Filled 2023-09-21: qty 15

## 2023-09-21 MED ORDER — FENTANYL CITRATE (PF) 100 MCG/2ML IJ SOLN
INTRAMUSCULAR | Status: AC
  Filled 2023-09-21: qty 2

## 2023-09-21 MED ORDER — SUGAMMADEX SODIUM 200 MG/2ML IV SOLN
INTRAVENOUS | Status: DC | PRN
  Administered 2023-09-21: 200 mg via INTRAVENOUS

## 2023-09-21 MED ORDER — ONDANSETRON HCL 4 MG/2ML IJ SOLN
INTRAMUSCULAR | Status: AC
  Filled 2023-09-21: qty 2

## 2023-09-21 MED ORDER — DEXAMETHASONE SODIUM PHOSPHATE 10 MG/ML IJ SOLN
INTRAMUSCULAR | Status: AC
  Filled 2023-09-21: qty 1

## 2023-09-21 MED ORDER — HYDROMORPHONE HCL 1 MG/ML IJ SOLN
INTRAMUSCULAR | Status: AC
  Filled 2023-09-21: qty 1

## 2023-09-21 MED ORDER — ONDANSETRON HCL 4 MG/2ML IJ SOLN
INTRAMUSCULAR | Status: DC | PRN
  Administered 2023-09-21: 4 mg via INTRAVENOUS

## 2023-09-21 MED ORDER — ROCURONIUM BROMIDE 100 MG/10ML IV SOLN
INTRAVENOUS | Status: DC | PRN
  Administered 2023-09-21 (×2): 20 mg via INTRAVENOUS
  Administered 2023-09-21: 60 mg via INTRAVENOUS

## 2023-09-21 MED ORDER — GLYCOPYRROLATE 0.2 MG/ML IJ SOLN
INTRAMUSCULAR | Status: DC | PRN
  Administered 2023-09-21: .2 mg via INTRAVENOUS

## 2023-09-21 MED ORDER — OXYCODONE HCL 5 MG PO TABS
5.0000 mg | ORAL_TABLET | Freq: Once | ORAL | Status: AC | PRN
  Administered 2023-09-21: 5 mg via ORAL

## 2023-09-21 MED ORDER — LIDOCAINE-EPINEPHRINE 1 %-1:100000 IJ SOLN
INTRAMUSCULAR | Status: AC
  Filled 2023-09-21: qty 1

## 2023-09-21 MED ORDER — OXYCODONE HCL 5 MG PO TABS
ORAL_TABLET | ORAL | Status: AC
  Filled 2023-09-21: qty 1

## 2023-09-21 MED ORDER — FENTANYL CITRATE (PF) 100 MCG/2ML IJ SOLN
INTRAMUSCULAR | Status: DC | PRN
  Administered 2023-09-21 (×2): 50 ug via INTRAVENOUS

## 2023-09-21 MED ORDER — CEFAZOLIN SODIUM-DEXTROSE 2-4 GM/100ML-% IV SOLN
INTRAVENOUS | Status: AC
  Filled 2023-09-21: qty 100

## 2023-09-21 MED ORDER — 0.9 % SODIUM CHLORIDE (POUR BTL) OPTIME
TOPICAL | Status: DC | PRN
  Administered 2023-09-21: 500 mL

## 2023-09-21 MED ORDER — OXYCODONE HCL 5 MG/5ML PO SOLN: 5.0000 mg | Freq: Once | ORAL | Status: AC | PRN

## 2023-09-21 MED ORDER — CELECOXIB 200 MG PO CAPS
ORAL_CAPSULE | ORAL | Status: AC
  Filled 2023-09-21: qty 1

## 2023-09-21 MED ORDER — CELECOXIB 200 MG PO CAPS
ORAL_CAPSULE | ORAL | Status: AC
Start: 2023-09-21 — End: 2023-09-21
  Filled 2023-09-21: qty 1

## 2023-09-21 MED ORDER — HYDROMORPHONE HCL 1 MG/ML IJ SOLN
0.2500 mg | INTRAMUSCULAR | Status: DC | PRN
  Administered 2023-09-21 (×4): 0.5 mg via INTRAVENOUS

## 2023-09-21 MED ORDER — PHENYLEPHRINE 80 MCG/ML (10ML) SYRINGE FOR IV PUSH (FOR BLOOD PRESSURE SUPPORT)
PREFILLED_SYRINGE | INTRAVENOUS | Status: AC
  Filled 2023-09-21: qty 10

## 2023-09-21 SURGICAL SUPPLY — 34 items
BAG URINE DRAIN 2000ML AR STRL (UROLOGICAL SUPPLIES) IMPLANT
CATH ROBINSON RED A/P 16FR (CATHETERS) IMPLANT
CATH URTH 16FR FL 2W BLN LF (CATHETERS) ×2 IMPLANT
DRAPE PERI LITHO V/GYN (MISCELLANEOUS) ×2 IMPLANT
DRAPE SURG 17X11 SM STRL (DRAPES) ×2 IMPLANT
DRAPE UNDER BUTTOCK W/FLU (DRAPES) ×2 IMPLANT
ELECTRODE REM PT RTRN 9FT ADLT (ELECTROSURGICAL) ×2 IMPLANT
GAUZE 4X4 16PLY ~~LOC~~+RFID DBL (SPONGE) ×2 IMPLANT
GLOVE SURG SYN 8.0 PF PI (GLOVE) IMPLANT
GOWN STRL REUS W/ TWL LRG LVL3 (GOWN DISPOSABLE) ×4 IMPLANT
GOWN STRL REUS W/ TWL XL LVL3 (GOWN DISPOSABLE) IMPLANT
IV NS 1000ML BAXH (IV SOLUTION) IMPLANT
KIT TURNOVER CYSTO (KITS) ×2 IMPLANT
LABEL OR SOLS (LABEL) ×2 IMPLANT
MANIFOLD NEPTUNE II (INSTRUMENTS) ×2 IMPLANT
NDL HYPO 22X1.5 SAFETY MO (MISCELLANEOUS) ×2 IMPLANT
NEEDLE HYPO 22X1.5 SAFETY MO (MISCELLANEOUS) ×2 IMPLANT
NS IRRIG 1000ML POUR BTL (IV SOLUTION) ×2 IMPLANT
NS IRRIG 500ML POUR BTL (IV SOLUTION) IMPLANT
PACK BASIN MINOR ARMC (MISCELLANEOUS) ×2 IMPLANT
PAD OB MATERNITY 11 LF (PERSONAL CARE ITEMS) ×2 IMPLANT
PAD PREP OB/GYN DISP 24X41 (PERSONAL CARE ITEMS) ×2 IMPLANT
SCRUB CHG 4% DYNA-HEX 4OZ (MISCELLANEOUS) ×2 IMPLANT
SET CYSTO W/LG BORE CLAMP LF (SET/KITS/TRAYS/PACK) ×2 IMPLANT
SOLUTION PREP PVP 2OZ (MISCELLANEOUS) ×2 IMPLANT
SURGILUBE 2OZ TUBE FLIPTOP (MISCELLANEOUS) ×2 IMPLANT
SUT PDS 2-0 27IN (SUTURE) IMPLANT
SUT VIC AB 0 CT1 27XCR 8 STRN (SUTURE) ×4 IMPLANT
SUT VIC AB 0 CT1 36 (SUTURE) ×2 IMPLANT
SUT VIC AB 2-0 SH 27XBRD (SUTURE) ×2 IMPLANT
SYR 10ML LL (SYRINGE) ×2 IMPLANT
SYR CONTROL 10ML LL (SYRINGE) ×2 IMPLANT
TRAP FLUID SMOKE EVACUATOR (MISCELLANEOUS) ×2 IMPLANT
WATER STERILE IRR 1000ML POUR (IV SOLUTION) ×2 IMPLANT

## 2023-09-21 NOTE — Anesthesia Preprocedure Evaluation (Addendum)
 Anesthesia Evaluation  Patient identified by MRN, date of birth, ID band Patient awake    Reviewed: Allergy & Precautions, NPO status , Patient's Chart, lab work & pertinent test results  Airway Mallampati: IV  TM Distance: >3 FB Neck ROM: full  Mouth opening: Limited Mouth Opening  Dental no notable dental hx.    Pulmonary sleep apnea    Pulmonary exam normal        Cardiovascular hypertension, On Medications Normal cardiovascular exam     Neuro/Psych  PSYCHIATRIC DISORDERS Anxiety Depression     Neuromuscular disease    GI/Hepatic Neg liver ROS,GERD  Medicated,,  Endo/Other  diabetes  Class 3 obesity  Renal/GU      Musculoskeletal   Abdominal   Peds  Hematology  (+) Blood dyscrasia, anemia   Anesthesia Other Findings Past Medical History: No date: Anxiety No date: Controlled type 2 diabetes mellitus without complication,  without long-term current use of insulin (HCC) No date: Depression No date: Fibromyalgia No date: GERD (gastroesophageal reflux disease) No date: Hyperlipidemia No date: Hypertension No date: Iron deficiency anemia, unspecified No date: Menorrhagia with regular cycle No date: Mixed stress and urge urinary incontinence No date: Pneumonia No date: Sleep apnea No date: Uterine polyp  Past Surgical History: 06/07/2015: ESOPHAGOGASTRODUODENOSCOPY (EGD) WITH PROPOFOL ; N/A     Comment:  Procedure: ESOPHAGOGASTRODUODENOSCOPY (EGD) WITH               PROPOFOL ;  Surgeon: Lamar ONEIDA Holmes, MD;  Location: [facility]              ENDOSCOPY;  Service: Endoscopy;  Laterality: N/A; No date: KIDNEY SURGERY No date: ROOT CANAL  BMI    Body Mass Index: 44.97 kg/m      Reproductive/Obstetrics negative OB ROS                              Anesthesia Physical Anesthesia Plan  ASA: 3  Anesthesia Plan: General ETT   Post-op Pain Management: Tylenol  PO (pre-op)*, Toradol IV  (intra-op)*, Dilaudid IV and Ketamine IV*   Induction: Intravenous  PONV Risk Score and Plan: 3 and Ondansetron, Dexamethasone, Midazolam  and Treatment may vary due to age or medical condition  Airway Management Planned: Oral ETT  Additional Equipment:   Intra-op Plan:   Post-operative Plan: Extubation in OR  Informed Consent: I have reviewed the patients History and Physical, chart, labs and discussed the procedure including the risks, benefits and alternatives for the proposed anesthesia with the patient or authorized representative who has indicated his/her understanding and acceptance.     Dental Advisory Given  Plan Discussed with: Anesthesiologist, CRNA and Surgeon  Anesthesia Plan Comments: (Patient consented for risks of anesthesia including but not limited to:  - adverse reactions to medications - damage to eyes, teeth, lips or other oral mucosa - nerve damage due to positioning  - sore throat or hoarseness - Damage to heart, brain, nerves, lungs, other parts of body or loss of life  Patient voiced understanding and assent.)         Anesthesia Quick Evaluation

## 2023-09-21 NOTE — Op Note (Addendum)
 OPERATIVE REPORT   PATIENT: Samantha Good MRN:  [id number] DATE OF PROCEDURE:  09/21/2023     PREOPERATIVE DIAGNOSIS: Abnormal uterine bleeding Anemia Morbid Obesity Type 2 diabetes Hypertension Hyperlipidemia   POSTOPERATIVE DIAGNOSIS:  Same   PROCEDURE:Total vaginal hysterectomy, bilateral salpingectomy, and cystoscopy   SURGEON:  Beverli Dinsmore, MD ASSISTANT: Debby Dinsmore, MD.    ANESTHESIA: GETA   Estimated blood loss in the OR: 100cc   Fluids: 700cc   UOP: 75cc   SPECIMEN: Uterus, cervix, and bilateral fallopian tubes   COMPLICATIONS: None   OPERATIVE FINDINGS:  Small mobile uterus. Narrow vaginal introitus. Normal appearing cervix. Enlarged but otherwise normal appearing right ovary. Bilateral paratubal cysts (1cm) that were removed. Normal bilateral fallopian tubes and left ovary. Small amount of bowel and peritoneum that were visualized were normal appearing. Survey of the bladder was done without evidence of suture or defects. Brisk efflux was noted from bilateral ureteral orifices.   PROCEDURE IN DETAIL: Patient was taken to the OR and identified. General anesthesia was induced. She was placed in the dorsal lithotomy position with the legs carefully placed in candy cane stirrups. The lower abdomen, perineum and the vagina were prepped with chlorhexidine  and draped in normal sterile fashion. Antibiotics were given (see MAR). A time out was conducted. A foley catheter was placed in the bladder; the bladder was allowed to drain and then the catheter was disconnected from the bag. A weighted speculum and Deaver were placed into the vagina. The cervix was grasped with two thyroid  tenaculums. 20cc dilute 1% lidocaine  with epi was injected circumferentially around the cervix.The cervix was elevated and the posterior cul-de-sac identified, then entered sharply with Mayos. A holding stitch of 0-vicryl was placed at the 6 o'clock position fixing the peritoneum to the  posterior vagina.  A long weighted speculum was placed. The uterosacral ligaments were clamped, cut and ligated with 0 vicryl. These were tagged for further use.  A circumferential incision from 4 o'clock to 8 o'clock was then made approximately 2 cm cephalad to the edge of the cervix using monopolar cautery. As tension was applied caudad on the anterior vaginal wall, attempt made to enter the anterior cul de sac however the peritoneum was too cephalad. The cardinal ligaments were then clamped, cut and ligated. Attention was returned anterior but the peritoneum remained too cephalad. Two additional rounds of clamp, cut, and ligate on each side required. The uterine arteries were then bilaterally clamped, transected, and suture ligated with 0 Vicryl suture. Attention returned anteriorly and the vaginal epithelium was bluntly dissected off the underlying cervix. The the anterior cul de sac was then identified and entered sharply with Metzembaum's. A curved Deaver retractor was placed over the bladder into the peritoneal cavity.    Sequential clamping, cutting, and suture ligating was continued. The round ligaments and remainder of the broad ligaments were clamped, cut and suture ligated. The uteroovarian ligaments were clamped, cut, and double suture ligated which freed the uterus. The uterus was removed. Next, right fallopian tube was identified, clamped, cut, and the pedicle was suture ligated. The same was performed on the left. The uterus and bilateral fallopian tubes were sent to pathology. The left pedicle was noted to be bleeding. Multiple figure of eights of 0 Vicryl were used to obtain hemostasis. The right pedicle was also noted to be bleeding and was sutured with 2-0 Vicryl in a running fashion. Hemostasis noted. All other pedicles were noted to be hemostatic.    The peritoneum was  then closed with a 2-0 PDS suture in a pursestring fashion. The vaginal cuff edges were then reapproximated in a vertical  fashion using running 0-vicryl. Halfway through closure of the cuff, the left and right uterosacrals were tied to together and then the cuff closure was continued. The middle of the cuff was noted to be oozing; two figures of eight of 0 Vicryl were used to obtain hemostasis. The vagina was then irrigated. All instruments were then removed. The vaginal cuff was palpated and noted to be intact.    Cystoscopy was then performed. Survey of the bladder was done without evidence of suture or defects. Brisk efflux was noted from bilateral ureteral orifices. Approximately 150cc of NS was left in the bladder at the end of cystoscopy. All sponge and instrument counts were normal. The patient was awakened and taken to the PACU in the stable condition.    Beverli LULLA Dinsmore, MD, 09/21/2023, 12:43 PM  *An assistant surgeon was necessary to successfully complete the stated procedure. They performed needed retraction, manipulation, and provided another set of surgical expertise throughout the case.

## 2023-09-21 NOTE — Interval H&P Note (Signed)
 History and Physical Interval Note:  09/21/2023 9:57 AM  Samantha Good  has presented today for surgery, with the diagnosis of abnormal uterine bleeding polypiron deficiency anemia.  The various methods of treatment have been discussed with the patient and family. After consideration of risks, benefits and other options for treatment, the patient has consented to  Procedure(s): HYSTERECTOMY, VAGINAL, WITH SALPINGECTOMY (Bilateral) CYSTOSCOPY (N/A) as a surgical intervention.  The patient's history has been reviewed, patient examined, no change in status, stable for surgery.  I have reviewed the patient's chart and labs.  Questions were answered to the patient's satisfaction.     [name]

## 2023-09-21 NOTE — Transfer of Care (Signed)
 Immediate Anesthesia Transfer of Care Note  Patient: Samantha Good  Procedure(s) Performed: HYSTERECTOMY, VAGINAL, WITH SALPINGECTOMY (Bilateral: Uterus) CYSTOSCOPY (Bladder)  Patient Location: PACU  Anesthesia Type:General  Level of Consciousness: awake  Airway & Oxygen Therapy: Patient Spontanous Breathing and Patient connected to face mask oxygen  Post-op Assessment: Report given to RN and Post -op Vital signs reviewed and stable  Post vital signs: Reviewed and stable  Last Vitals:  Vitals Value Taken Time  BP    Temp    Pulse 95 09/21/23 12:51  Resp 18 09/21/23 12:51  SpO2 98 % 09/21/23 12:51  Vitals shown include unfiled device data.  Last Pain:  Vitals:   09/21/23 0920  TempSrc: Temporal  PainSc: 0-No pain         Complications: There were no known notable events for this encounter.

## 2023-09-21 NOTE — Anesthesia Postprocedure Evaluation (Signed)
 Anesthesia Post Note  Patient: KEON BENSCOTER  Procedure(s) Performed: HYSTERECTOMY, VAGINAL, WITH SALPINGECTOMY (Bilateral: Uterus) CYSTOSCOPY (Bladder)  Patient location during evaluation: PACU Anesthesia Type: General Level of consciousness: awake and alert Pain management: pain level controlled Vital Signs Assessment: post-procedure vital signs reviewed and stable Respiratory status: spontaneous breathing, nonlabored ventilation, respiratory function stable and patient connected to nasal cannula oxygen Cardiovascular status: blood pressure returned to baseline and stable Postop Assessment: no apparent nausea or vomiting Anesthetic complications: no   There were no known notable events for this encounter.   Last Vitals:  Vitals:   09/21/23 1420 09/21/23 1425  BP:  112/73  Pulse: 88 89  Resp: 19 (!) 21  Temp: 36.6 C   SpO2: 92% 93%    Last Pain:  Vitals:   09/21/23 1410  TempSrc:   PainSc: 2                  Lendia LITTIE Mae

## 2023-09-21 NOTE — Anesthesia Procedure Notes (Signed)
 Procedure Name: Intubation Date/Time: 09/21/2023 10:24 AM  Performed by: Bonnetta Jimmey SAUNDERS, CRNAPre-anesthesia Checklist: Patient identified, Emergency Drugs available, Suction available and Patient being monitored Patient Re-evaluated:Patient Re-evaluated prior to induction Oxygen Delivery Method: Circle system utilized Preoxygenation: Pre-oxygenation with 100% oxygen Induction Type: IV induction Ventilation: Mask ventilation without difficulty Laryngoscope Size: McGrath and 3 Grade View: Grade III Tube type: Oral Number of attempts: 1 Airway Equipment and Method: Stylet and Oral airway Placement Confirmation: ETT inserted through vocal cords under direct vision, positive ETCO2 and breath sounds checked- equal and bilateral Secured at: 21 cm Tube secured with: Tape Dental Injury: Teeth and Oropharynx as per pre-operative assessment

## 2023-09-22 ENCOUNTER — Encounter: Payer: Self-pay | Admitting: Obstetrics and Gynecology

## 2023-09-24 ENCOUNTER — Other Ambulatory Visit: Payer: Self-pay

## 2023-09-24 ENCOUNTER — Encounter: Payer: Self-pay | Admitting: Emergency Medicine

## 2023-09-24 DIAGNOSIS — R000 Tachycardia, unspecified: Secondary | ICD-10-CM | POA: Diagnosis not present

## 2023-09-24 DIAGNOSIS — G8918 Other acute postprocedural pain: Secondary | ICD-10-CM | POA: Diagnosis present

## 2023-09-24 DIAGNOSIS — R1030 Lower abdominal pain, unspecified: Secondary | ICD-10-CM | POA: Insufficient documentation

## 2023-09-24 DIAGNOSIS — R112 Nausea with vomiting, unspecified: Secondary | ICD-10-CM | POA: Diagnosis not present

## 2023-09-24 DIAGNOSIS — R197 Diarrhea, unspecified: Secondary | ICD-10-CM | POA: Diagnosis not present

## 2023-09-24 LAB — COMPREHENSIVE METABOLIC PANEL WITH GFR
ALT: 14 U/L (ref 0–44)
AST: 16 U/L (ref 15–41)
Albumin: 4 g/dL (ref 3.5–5.0)
Alkaline Phosphatase: 70 U/L (ref 38–126)
Anion gap: 12 (ref 5–15)
BUN: 12 mg/dL (ref 6–20)
CO2: 24 mmol/L (ref 22–32)
Calcium: 10.4 mg/dL — ABNORMAL HIGH (ref 8.9–10.3)
Chloride: 105 mmol/L (ref 98–111)
Creatinine, Ser: 0.91 mg/dL (ref 0.44–1.00)
GFR, Estimated: >60 mL/min (ref >60)
Glucose, Bld: 126 mg/dL — ABNORMAL HIGH (ref 70–99)
Potassium: 4 mmol/L (ref 3.5–5.1)
Sodium: 141 mmol/L (ref 135–145)
Total Bilirubin: 0.6 mg/dL (ref 0.0–1.2)
Total Protein: 7.7 g/dL (ref 6.5–8.1)

## 2023-09-24 LAB — CBC WITH DIFFERENTIAL/PLATELET
Abs Immature Granulocytes: 0.04 K/uL (ref 0.00–0.07)
Basophils Absolute: 0.1 K/uL (ref 0.0–0.1)
Basophils Relative: 1 %
Eosinophils Absolute: 0.2 K/uL (ref 0.0–0.5)
Eosinophils Relative: 2 %
HCT: 39.7 % (ref 36.0–46.0)
Hemoglobin: 12 g/dL (ref 12.0–15.0)
Immature Granulocytes: 0 %
Lymphocytes Relative: 27 %
Lymphs Abs: 2.6 K/uL (ref 0.7–4.0)
MCH: 22.7 pg — ABNORMAL LOW (ref 26.0–34.0)
MCHC: 30.2 g/dL (ref 30.0–36.0)
MCV: 75 fL — ABNORMAL LOW (ref 80.0–100.0)
Monocytes Absolute: 0.5 K/uL (ref 0.1–1.0)
Monocytes Relative: 5 %
Neutro Abs: 6.2 K/uL (ref 1.7–7.7)
Neutrophils Relative %: 65 %
Platelets: 334 K/uL (ref 150–400)
RBC: 5.29 MIL/uL — ABNORMAL HIGH (ref 3.87–5.11)
RDW: 16.5 % — ABNORMAL HIGH (ref 11.5–15.5)
WBC: 9.5 K/uL (ref 4.0–10.5)
nRBC: 0 % (ref 0.0–0.2)

## 2023-09-24 LAB — SURGICAL PATHOLOGY

## 2023-09-24 MED ORDER — PROMETHAZINE HCL 25 MG PO TABS
25.0000 mg | ORAL_TABLET | Freq: Once | ORAL | Status: AC
  Administered 2023-09-25: 25 mg via ORAL
  Filled 2023-09-24: qty 1

## 2023-09-24 NOTE — ED Triage Notes (Incomplete)
 Pt presents to the ED via POV with complaints of Nausea that started today. Of note,  pt had an uncomplicated hysterectomy on Friday. Prescribed zofran   - last dose taken at 1800 with minimal improvement. A&Ox4 at this time. Denies fevers, chills, CP or SOB.

## 2023-09-25 ENCOUNTER — Emergency Department

## 2023-09-25 ENCOUNTER — Emergency Department
Admission: EM | Admit: 2023-09-25 | Discharge: 2023-09-25 | Disposition: A | Discharge status: Home / Self Care | Attending: Emergency Medicine | Admitting: Emergency Medicine

## 2023-09-25 DIAGNOSIS — R112 Nausea with vomiting, unspecified: Secondary | ICD-10-CM

## 2023-09-25 DIAGNOSIS — G8918 Other acute postprocedural pain: Secondary | ICD-10-CM

## 2023-09-25 LAB — URINALYSIS, W/ REFLEX TO CULTURE (INFECTION SUSPECTED)
Bacteria, UA: NONE SEEN
Bilirubin Urine: NEGATIVE
Glucose, UA: NEGATIVE mg/dL
Ketones, ur: NEGATIVE mg/dL
Nitrite: NEGATIVE
Protein, ur: NEGATIVE mg/dL
Specific Gravity, Urine: 1.031 — ABNORMAL HIGH (ref 1.005–1.030)
pH: 7 (ref 5.0–8.0)

## 2023-09-25 MED ORDER — FAMOTIDINE IN NACL 20-0.9 MG/50ML-% IV SOLN
20.0000 mg | Freq: Once | INTRAVENOUS | Status: AC
  Administered 2023-09-25: 20 mg via INTRAVENOUS
  Filled 2023-09-25: qty 50

## 2023-09-25 MED ORDER — SODIUM CHLORIDE 0.9 % IV BOLUS
1000.0000 mL | Freq: Once | INTRAVENOUS | Status: AC
  Administered 2023-09-25: 1000 mL via INTRAVENOUS

## 2023-09-25 MED ORDER — ALUM & MAG HYDROXIDE-SIMETH 200-200-20 MG/5ML PO SUSP
30.0000 mL | Freq: Once | ORAL | Status: AC
  Administered 2023-09-25: 30 mL via ORAL
  Filled 2023-09-25: qty 30

## 2023-09-25 MED ORDER — IOHEXOL 350 MG/ML SOLN
100.0000 mL | Freq: Once | INTRAVENOUS | Status: AC | PRN
  Administered 2023-09-25: 100 mL via INTRAVENOUS

## 2023-09-25 MED ORDER — MORPHINE SULFATE (PF) 4 MG/ML IV SOLN
4.0000 mg | Freq: Once | INTRAVENOUS | Status: AC
  Administered 2023-09-25: 4 mg via INTRAVENOUS
  Filled 2023-09-25: qty 1

## 2023-09-25 NOTE — ED Provider Notes (Signed)
 [facility] Provider Note    Event Date/Time   First MD Initiated Contact with Patient 09/25/23 0142     (approximate)   History   Post-op Problem and Nausea   HPI  Samantha Good is a 43 y.o. female presents to the emergency department following transvaginal hysterectomy on Friday with abdominal pain with nausea and vomiting.  States that she had worsening abdominal pain and felt like she was having bloating and fullness.  A lot of lower abdominal pressure.  Has been having burning with urination since the catheter was removed.  Endorses nausea with vomiting.  Multiple episodes of diarrhea that has been nonbloody today.  No recent antibiotic use.  No recent travel.  No fever or chills.  No chest pain or shortness of breath     Physical Exam   Triage Vital Signs: ED Triage Vitals  Encounter Vitals Group     BP 09/24/23 2054 (!) 158/83     Girls Systolic BP Percentile --      Girls Diastolic BP Percentile --      Boys Systolic BP Percentile --      Boys Diastolic BP Percentile --      Pulse Rate 09/24/23 2054 (!) 108     Resp 09/24/23 2054 20     Temp 09/24/23 2054 98.3 F (36.8 C)     Temp Source 09/24/23 2054 Oral     SpO2 09/24/23 2054 98 %     Weight 09/24/23 2051 238 lb (108 kg)     Height 09/24/23 2051 5' 1 (1.549 m)     Head Circumference --      Peak Flow --      Pain Score 09/24/23 2051 0     Pain Loc --      Pain Education --      Exclude from Growth Chart --     Most recent vital signs: Vitals:   09/24/23 2054  BP: (!) 158/83  Pulse: (!) 108  Resp: 20  Temp: 98.3 F (36.8 C)  SpO2: 98%    Physical Exam Constitutional:      Appearance: She is well-developed.  HENT:     Head: Atraumatic.  Eyes:     Conjunctiva/sclera: Conjunctivae normal.  Cardiovascular:     Rate and Rhythm: Regular rhythm. Tachycardia present.  Pulmonary:     Effort: No respiratory distress.  Abdominal:     General: There is no distension.      Tenderness: There is abdominal tenderness (Mild diffuse lower abdominal tenderness to palpation with no rebound or guarding).  Musculoskeletal:        General: Normal range of motion.     Cervical back: Normal range of motion.     Right lower leg: No edema.     Left lower leg: No edema.  Skin:    General: Skin is warm.     Capillary Refill: Capillary refill takes less than 2 seconds.  Neurological:     Mental Status: She is alert. Mental status is at baseline.     IMPRESSION / MDM / ASSESSMENT AND PLAN / ED COURSE  I reviewed the triage vital signs and the nursing notes.  Differential diagnosis including postoperative pain, constipation, viral gastroenteritis, small bowel obstruction, abscess, urinary tract infection, kidney stone, pyelonephritis  Given IV fluids, antiemetics and GI cocktail   RADIOLOGY I independently reviewed imaging, my interpretation of imaging: CT scan with no signs of a bowel obstruction.  CT scan  read as status post hysterectomy with no other acute intra-abdominal pathology.  LABS (all labs ordered are listed, but only abnormal results are displayed) Labs interpreted as -    Labs Reviewed  CBC WITH DIFFERENTIAL/PLATELET - Abnormal; Notable for the following components:      Result Value   RBC 5.29 (*)    MCV 75.0 (*)    MCH 22.7 (*)    RDW 16.5 (*)    All other components within normal limits  COMPREHENSIVE METABOLIC PANEL WITH GFR - Abnormal; Notable for the following components:   Glucose, Bld 126 (*)    Calcium 10.4 (*)    All other components within normal limits  URINALYSIS, W/ REFLEX TO CULTURE (INFECTION SUSPECTED) - Abnormal; Notable for the following components:   Color, Urine STRAW (*)    APPearance CLEAR (*)    Specific Gravity, Urine 1.031 (*)    Hgb urine dipstick SMALL (*)    Leukocytes,Ua TRACE (*)    All other components within normal limits     MDM  Lab work overall reassuring with no significant leukocytosis.  Creatinine  appears to be at baseline.  Mildly elevated calcium likely in the setting of dehydration.  Urine with no signs of urinary tract infection.  On reevaluation patient states she is feeling much better.  Tolerating p.o.  Has pain medication and antiemetics.  Discussed close follow-up as an outpatient with her gynecology.  Discussed return precautions for any ongoing or worsening symptoms.     PROCEDURES:  Critical Care performed: No  Procedures  Patient's presentation is most consistent with acute presentation with potential threat to life or bodily function.   MEDICATIONS ORDERED IN ED: Medications  famotidine  (PEPCID ) IVPB 20 mg premix (20 mg Intravenous New Bag/Given 09/25/23 0321)  promethazine  (PHENERGAN ) tablet 25 mg (25 mg Oral Given 09/25/23 0152)  sodium chloride  0.9 % bolus 1,000 mL (1,000 mLs Intravenous New Bag/Given 09/25/23 0207)  morphine  (PF) 4 MG/ML injection 4 mg (4 mg Intravenous Given 09/25/23 0208)  iohexol  (OMNIPAQUE ) 350 MG/ML injection 100 mL (100 mLs Intravenous Contrast Given 09/25/23 0231)  alum & mag hydroxide-simeth (MAALOX/MYLANTA) 200-200-20 MG/5ML suspension 30 mL (30 mLs Oral Given 09/25/23 0321)    FINAL CLINICAL IMPRESSION(S) / ED DIAGNOSES   Final diagnoses:  Post-operative pain  Nausea and vomiting, unspecified vomiting type     Rx / DC Orders   ED Discharge Orders     None        Note:  This document was prepared using Dragon voice recognition software and may include unintentional dictation errors.   Suzanne Kirsch, MD 09/25/23 [phone]

## 2023-09-25 NOTE — Discharge Instructions (Signed)
 You were emergency department for abdominal pain with nausea and vomiting.  Your lab work and your urine overall were reassuring.  You had a CT scan that did not show any abnormalities.  You are given IV fluids and pain medication in the emergency department with improvement of your symptoms.  You can take Zofran  4 to 8 mg every 8 hours as needed for nausea and vomiting.  Call your gynecology team to schedule close follow-up appointment.  Return to the emergency department for any worsening symptoms.  Stay hydrated and drink plenty of fluids

## 2023-10-18 ENCOUNTER — Ambulatory Visit
Admission: RE | Admit: 2023-10-18 | Discharge: 2023-10-18 | Disposition: A | Discharge status: Home / Self Care | Source: Ambulatory Visit | Attending: Nurse Practitioner | Admitting: Nurse Practitioner

## 2023-10-18 DIAGNOSIS — Z1231 Encounter for screening mammogram for malignant neoplasm of breast: Secondary | ICD-10-CM | POA: Diagnosis present
